# Patient Record
Sex: Female | Born: 1950 | Race: White | Hispanic: No | Marital: Single | Smoking: Never smoker
Health system: Southern US, Community
[De-identification: ages and names within clinical notes are randomized; demographics above are authoritative.]

## PROBLEM LIST (undated history)

## (undated) DIAGNOSIS — N059 Unspecified nephritic syndrome with unspecified morphologic changes: Secondary | ICD-10-CM

## (undated) DIAGNOSIS — J45909 Unspecified asthma, uncomplicated: Secondary | ICD-10-CM

## (undated) DIAGNOSIS — Z8542 Personal history of malignant neoplasm of other parts of uterus: Secondary | ICD-10-CM

## (undated) DIAGNOSIS — Z8669 Personal history of other diseases of the nervous system and sense organs: Secondary | ICD-10-CM

## (undated) DIAGNOSIS — I739 Peripheral vascular disease, unspecified: Secondary | ICD-10-CM

## (undated) DIAGNOSIS — R51 Headache: Secondary | ICD-10-CM

## (undated) DIAGNOSIS — S0300XA Dislocation of jaw, unspecified side, initial encounter: Secondary | ICD-10-CM

## (undated) DIAGNOSIS — N183 Chronic kidney disease, stage 3 unspecified: Secondary | ICD-10-CM

## (undated) DIAGNOSIS — C801 Malignant (primary) neoplasm, unspecified: Secondary | ICD-10-CM

## (undated) DIAGNOSIS — D649 Anemia, unspecified: Secondary | ICD-10-CM

## (undated) DIAGNOSIS — K219 Gastro-esophageal reflux disease without esophagitis: Secondary | ICD-10-CM

## (undated) DIAGNOSIS — E785 Hyperlipidemia, unspecified: Secondary | ICD-10-CM

## (undated) DIAGNOSIS — G4730 Sleep apnea, unspecified: Secondary | ICD-10-CM

## (undated) DIAGNOSIS — I10 Essential (primary) hypertension: Secondary | ICD-10-CM

## (undated) DIAGNOSIS — R011 Cardiac murmur, unspecified: Secondary | ICD-10-CM

## (undated) DIAGNOSIS — K635 Polyp of colon: Secondary | ICD-10-CM

## (undated) DIAGNOSIS — T7840XA Allergy, unspecified, initial encounter: Secondary | ICD-10-CM

## (undated) DIAGNOSIS — E039 Hypothyroidism, unspecified: Secondary | ICD-10-CM

## (undated) DIAGNOSIS — H269 Unspecified cataract: Secondary | ICD-10-CM

## (undated) DIAGNOSIS — C189 Malignant neoplasm of colon, unspecified: Secondary | ICD-10-CM

## (undated) DIAGNOSIS — M1990 Unspecified osteoarthritis, unspecified site: Secondary | ICD-10-CM

## (undated) HISTORY — DX: Chronic kidney disease, stage 3 unspecified: N18.30

## (undated) HISTORY — PX: KNEE ARTHROSCOPY: SUR90

## (undated) HISTORY — DX: Personal history of malignant neoplasm of other parts of uterus: Z85.42

## (undated) HISTORY — DX: Hyperlipidemia, unspecified: E78.5

## (undated) HISTORY — DX: Unspecified asthma, uncomplicated: J45.909

## (undated) HISTORY — PX: RENAL BIOPSY: SHX156

## (undated) HISTORY — DX: Polyp of colon: K63.5

## (undated) HISTORY — DX: Personal history of other diseases of the nervous system and sense organs: Z86.69

## (undated) HISTORY — PX: TONSILLECTOMY AND ADENOIDECTOMY: SUR1326

## (undated) HISTORY — PX: HIP ARTHROPLASTY: SHX981

## (undated) HISTORY — PX: LYMPHADENECTOMY: SHX15

## (undated) HISTORY — DX: Chronic kidney disease, stage 3 (moderate): N18.3

## (undated) HISTORY — DX: Malignant neoplasm of colon, unspecified: C18.9

## (undated) HISTORY — DX: Sleep apnea, unspecified: G47.30

## (undated) HISTORY — DX: Unspecified cataract: H26.9

## (undated) HISTORY — PX: ABDOMINAL HYSTERECTOMY: SHX81

## (undated) HISTORY — DX: Hypothyroidism, unspecified: E03.9

## (undated) HISTORY — DX: Anemia, unspecified: D64.9

## (undated) HISTORY — DX: Dislocation of jaw, unspecified side, initial encounter: S03.00XA

## (undated) HISTORY — PX: TOTAL ABDOMINAL HYSTERECTOMY W/ BILATERAL SALPINGOOPHORECTOMY: SHX83

## (undated) HISTORY — PX: UPPER GASTROINTESTINAL ENDOSCOPY: SHX188

## (undated) HISTORY — DX: Allergy, unspecified, initial encounter: T78.40XA

## (undated) HISTORY — PX: COLONOSCOPY W/ POLYPECTOMY: SHX1380

## (undated) HISTORY — DX: Essential (primary) hypertension: I10

## (undated) HISTORY — DX: Unspecified nephritic syndrome with unspecified morphologic changes: N05.9

## (undated) HISTORY — PX: EXPLORATORY LAPAROTOMY: SUR591

## (undated) HISTORY — PX: COLONOSCOPY: SHX174

---

## 1999-03-04 ENCOUNTER — Other Ambulatory Visit: Admission: RE | Admit: 1999-03-04 | Discharge: 1999-03-04 | Payer: Self-pay | Admitting: *Deleted

## 2000-04-10 ENCOUNTER — Other Ambulatory Visit: Admission: RE | Admit: 2000-04-10 | Discharge: 2000-04-10 | Payer: Self-pay | Admitting: *Deleted

## 2001-03-11 ENCOUNTER — Other Ambulatory Visit: Admission: RE | Admit: 2001-03-11 | Discharge: 2001-03-11 | Payer: Self-pay | Admitting: *Deleted

## 2001-05-26 ENCOUNTER — Encounter: Payer: Self-pay | Admitting: Emergency Medicine

## 2001-05-26 ENCOUNTER — Emergency Department (HOSPITAL_COMMUNITY): Admission: EM | Admit: 2001-05-26 | Discharge: 2001-05-26 | Payer: Self-pay | Admitting: Unknown Physician Specialty

## 2002-03-14 ENCOUNTER — Other Ambulatory Visit: Admission: RE | Admit: 2002-03-14 | Discharge: 2002-03-14 | Payer: Self-pay | Admitting: *Deleted

## 2002-12-15 DIAGNOSIS — K635 Polyp of colon: Secondary | ICD-10-CM

## 2002-12-15 HISTORY — DX: Polyp of colon: K63.5

## 2002-12-16 ENCOUNTER — Encounter (INDEPENDENT_AMBULATORY_CARE_PROVIDER_SITE_OTHER): Payer: Self-pay | Admitting: Specialist

## 2002-12-16 ENCOUNTER — Ambulatory Visit (HOSPITAL_COMMUNITY): Admission: RE | Admit: 2002-12-16 | Discharge: 2002-12-16 | Payer: Self-pay | Admitting: Gastroenterology

## 2003-03-27 ENCOUNTER — Other Ambulatory Visit: Admission: RE | Admit: 2003-03-27 | Discharge: 2003-03-27 | Payer: Self-pay | Admitting: *Deleted

## 2004-03-13 ENCOUNTER — Other Ambulatory Visit: Admission: RE | Admit: 2004-03-13 | Discharge: 2004-03-13 | Payer: Self-pay | Admitting: Family Medicine

## 2005-01-29 ENCOUNTER — Encounter: Admission: RE | Admit: 2005-01-29 | Discharge: 2005-01-29 | Payer: Self-pay | Admitting: Obstetrics and Gynecology

## 2005-01-30 ENCOUNTER — Encounter: Admission: RE | Admit: 2005-01-30 | Discharge: 2005-01-30 | Payer: Self-pay | Admitting: Obstetrics and Gynecology

## 2005-01-31 ENCOUNTER — Encounter: Admission: RE | Admit: 2005-01-31 | Discharge: 2005-01-31 | Payer: Self-pay | Admitting: Obstetrics and Gynecology

## 2005-02-04 ENCOUNTER — Ambulatory Visit: Admission: RE | Admit: 2005-02-04 | Discharge: 2005-02-04 | Payer: Self-pay | Admitting: Gynecology

## 2005-02-04 ENCOUNTER — Ambulatory Visit (HOSPITAL_COMMUNITY): Admission: RE | Admit: 2005-02-04 | Discharge: 2005-02-04 | Payer: Self-pay | Admitting: Gastroenterology

## 2005-02-18 ENCOUNTER — Encounter (INDEPENDENT_AMBULATORY_CARE_PROVIDER_SITE_OTHER): Payer: Self-pay | Admitting: *Deleted

## 2005-02-18 ENCOUNTER — Inpatient Hospital Stay (HOSPITAL_COMMUNITY): Admission: RE | Admit: 2005-02-18 | Discharge: 2005-02-20 | Payer: Self-pay | Admitting: Obstetrics & Gynecology

## 2005-02-18 ENCOUNTER — Encounter (INDEPENDENT_AMBULATORY_CARE_PROVIDER_SITE_OTHER): Payer: Self-pay | Admitting: Specialist

## 2005-03-05 ENCOUNTER — Ambulatory Visit: Admission: RE | Admit: 2005-03-05 | Discharge: 2005-03-05 | Payer: Self-pay | Admitting: Gynecology

## 2005-04-11 ENCOUNTER — Ambulatory Visit: Admission: RE | Admit: 2005-04-11 | Discharge: 2005-04-11 | Payer: Self-pay | Admitting: Gynecology

## 2005-07-15 ENCOUNTER — Encounter (INDEPENDENT_AMBULATORY_CARE_PROVIDER_SITE_OTHER): Payer: Self-pay | Admitting: *Deleted

## 2005-07-15 ENCOUNTER — Other Ambulatory Visit: Admission: RE | Admit: 2005-07-15 | Discharge: 2005-07-15 | Payer: Self-pay | Admitting: Gynecology

## 2005-07-15 ENCOUNTER — Ambulatory Visit: Admission: RE | Admit: 2005-07-15 | Discharge: 2005-07-15 | Payer: Self-pay | Admitting: Gynecology

## 2005-11-21 ENCOUNTER — Other Ambulatory Visit: Admission: RE | Admit: 2005-11-21 | Discharge: 2005-11-21 | Payer: Self-pay | Admitting: Obstetrics and Gynecology

## 2006-02-20 ENCOUNTER — Encounter: Admission: RE | Admit: 2006-02-20 | Discharge: 2006-02-20 | Payer: Self-pay | Admitting: Family Medicine

## 2006-06-23 ENCOUNTER — Other Ambulatory Visit: Admission: RE | Admit: 2006-06-23 | Discharge: 2006-06-23 | Payer: Self-pay | Admitting: Obstetrics and Gynecology

## 2006-12-02 ENCOUNTER — Encounter (INDEPENDENT_AMBULATORY_CARE_PROVIDER_SITE_OTHER): Payer: Self-pay | Admitting: *Deleted

## 2006-12-02 ENCOUNTER — Other Ambulatory Visit: Admission: RE | Admit: 2006-12-02 | Discharge: 2006-12-02 | Payer: Self-pay | Admitting: Gynecology

## 2006-12-02 ENCOUNTER — Ambulatory Visit: Admission: RE | Admit: 2006-12-02 | Discharge: 2006-12-02 | Payer: Self-pay | Admitting: Gynecology

## 2007-03-15 ENCOUNTER — Ambulatory Visit (HOSPITAL_COMMUNITY): Admission: RE | Admit: 2007-03-15 | Discharge: 2007-03-15 | Payer: Self-pay | Admitting: Family Medicine

## 2007-08-31 ENCOUNTER — Other Ambulatory Visit: Admission: RE | Admit: 2007-08-31 | Discharge: 2007-08-31 | Payer: Self-pay | Admitting: Obstetrics and Gynecology

## 2007-12-15 ENCOUNTER — Ambulatory Visit: Admission: RE | Admit: 2007-12-15 | Discharge: 2007-12-15 | Payer: Self-pay | Admitting: Gynecology

## 2007-12-15 ENCOUNTER — Encounter: Payer: Self-pay | Admitting: Gynecology

## 2007-12-15 ENCOUNTER — Other Ambulatory Visit: Admission: RE | Admit: 2007-12-15 | Discharge: 2007-12-15 | Payer: Self-pay | Admitting: Gynecology

## 2008-03-15 ENCOUNTER — Ambulatory Visit (HOSPITAL_COMMUNITY): Admission: RE | Admit: 2008-03-15 | Discharge: 2008-03-15 | Payer: Self-pay | Admitting: Family Medicine

## 2008-05-30 ENCOUNTER — Other Ambulatory Visit: Admission: RE | Admit: 2008-05-30 | Discharge: 2008-05-30 | Payer: Self-pay | Admitting: Obstetrics and Gynecology

## 2008-11-01 ENCOUNTER — Encounter: Payer: Self-pay | Admitting: Gynecology

## 2008-11-01 ENCOUNTER — Other Ambulatory Visit: Admission: RE | Admit: 2008-11-01 | Discharge: 2008-11-01 | Payer: Self-pay | Admitting: Gynecology

## 2008-11-01 ENCOUNTER — Ambulatory Visit: Admission: RE | Admit: 2008-11-01 | Discharge: 2008-11-01 | Payer: Self-pay | Admitting: Gynecology

## 2009-03-20 ENCOUNTER — Ambulatory Visit (HOSPITAL_COMMUNITY): Admission: RE | Admit: 2009-03-20 | Discharge: 2009-03-20 | Payer: Self-pay | Admitting: Family Medicine

## 2009-05-30 ENCOUNTER — Other Ambulatory Visit: Admission: RE | Admit: 2009-05-30 | Discharge: 2009-05-30 | Payer: Self-pay | Admitting: Obstetrics and Gynecology

## 2009-12-07 ENCOUNTER — Ambulatory Visit: Admission: RE | Admit: 2009-12-07 | Discharge: 2009-12-07 | Payer: Self-pay | Admitting: Gynecology

## 2009-12-07 ENCOUNTER — Other Ambulatory Visit: Admission: RE | Admit: 2009-12-07 | Discharge: 2009-12-07 | Payer: Self-pay | Admitting: Gynecology

## 2010-03-22 ENCOUNTER — Ambulatory Visit (HOSPITAL_COMMUNITY): Admission: RE | Admit: 2010-03-22 | Discharge: 2010-03-22 | Payer: Self-pay | Admitting: Family Medicine

## 2010-06-04 ENCOUNTER — Other Ambulatory Visit: Admission: RE | Admit: 2010-06-04 | Discharge: 2010-06-04 | Payer: Self-pay | Admitting: Obstetrics and Gynecology

## 2010-10-06 ENCOUNTER — Encounter: Payer: Self-pay | Admitting: Obstetrics and Gynecology

## 2010-10-06 ENCOUNTER — Encounter: Payer: Self-pay | Admitting: *Deleted

## 2011-01-28 NOTE — Consult Note (Signed)
NAMEJANIKA, Colon             ACCOUNT NO.:  [id number]   MEDICAL RECORD NO.:  [id number]          PATIENT TYPE:  OUT   LOCATION:  GYN                          FACILITY:  [facility]   PHYSICIAN:  De Blanch, M.D.DATE OF BIRTH:  08-05-1951   DATE OF CONSULTATION:  DATE OF DISCHARGE:                                 CONSULTATION   CHIEF COMPLAINT:  Endometrial cancer.   INTERVAL HISTORY:  The patient returns today for continuing followup.  She saw Dr. Thomasena Edis approximately 6 months ago and returns today as  scheduled.  Since her last visit, she has done well.  She denies any GI  or GU  symptoms.  Has no pelvic pain, pressure, vaginal bleeding or  discharge.  Functional status has been excellent.   HISTORY OF PRESENT ILLNESS:  Stage 1a grade 3 endometrial carcinoma,  undergoing initial TAH-BSO,  pelvic and periaortic lymphadenectomy June  2006.  She had good prognostic features, and no further therapy was  advised.   PAST MEDICAL HISTORY:  Medical illnesses:  Hypercholesterolemia,  menopause and hypothyroidism.   PAST SURGICAL HISTORY:  Renal biopsy for nephritis, TAH-BSO, pelvic and  periaortic lymphadenectomy.   DRUG ALLERGIES:  SULFA.   CURRENT MEDICATIONS:  Synthroid, Lipitor, multivitamins.   FAMILY HISTORY:  Paternal family history of colon cancer and maternal  aunt with breast cancer.   SOCIAL HISTORY:  The patient is not married.  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  She  does not smoke.   REVIEW OF SYSTEMS:  Ten-point comprehensive review of systems negative  except as noted above.   PHYSICAL EXAMINATION:  VITAL SIGNS:  Weight 196 pounds (stable).  Blood  pressure 142/86.  GENERAL:  The patient is a pleasant, mildly obese white female in no  acute distress.  HEENT:  Negative.  NECK:  Supple without thyromegaly.  There is no supraclavicular or  inguinal adenopathy.  ABDOMEN:  Soft, nontender.  No  mass or organomegaly, ascites or hernia  is noted.  PELVIC:  EG, BUS, vagina, bladder, urethra are normal.  Cervix and  uterus are surgically absent.  Adnexa without masses.  Rectovaginal exam  confirms.  EXTREMITIES:  Lower extremities are without edema or varicosities.   IMPRESSION:  Stage 1a grade 3 endometrial cancer in 2006.  No evidence  of recurrent disease.   PLAN:  The patient will return to see Dr. Thomasena Edis in 6 months, return to  see [place] in 1 year.  Pap smears are repeated today.      De Blanch, M.D.  Electronically Signed     DC/MEDQ  D:  12/15/2007  T:  12/15/2007  Job:  [id number]   cc:   Artist Pais, M.D.  Fax: [phone]   Telford Nab, R.N.  [address]  [place], [place] [address]

## 2011-01-28 NOTE — Consult Note (Signed)
[name], [name]             ACCOUNT NO.:  [id number]   MEDICAL RECORD NO.:  [id number]           PATIENT TYPE:  OUT   LOCATION:  GYN                          FACILITY:  [facility]   PHYSICIAN:  De Blanch, M.D.DATE OF BIRTH:  12-26-1950   DATE OF CONSULTATION:  DATE OF DISCHARGE:                                 CONSULTATION   CHIEF COMPLAINT:  Endometrial cancer.   INTERVAL HISTORY:  The patient returns today for continuing follow-up of  her endometrial cancer.  Since her last visit she has done well.  She  denies any GI or GU symptoms.  Has no pelvic pain, pressure, vaginal  bleeding or discharge.  Her functional status is excellent.   HISTORY OF PRESENT ILLNESS:  Stage IA, grade 3 endometrial  adenocarcinoma.  The patient underwent initial surgical staging June  2006.  She had good prognostic features, and no further therapy was  recommended.   PAST MEDICAL HISTORY:  Medical illnesses:   1. Hyperlipidemia.  2. Menopause.  3. Hypothyroidism.   PAST SURGICAL HISTORY:  1. Renal biopsy for nephritis.  2. TAH-BSO.  3. Pelvic and periaortic lymphadenectomy.   DRUG ALLERGIES:  SULFA (rash).   CURRENT MEDICATIONS:  Lipitor, multivitamin, Synthroid.   FAMILY HISTORY:  Paternal family history of colon cancer, a maternal  aunt with breast cancer.   SOCIAL HISTORY:  The patient is not married.  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  She is  based in [place].  Travels to [place] frequently.  She does  not smoke.   REVIEW OF SYSTEMS:  Ten-point comprehensive review of systems negative  except as noted above.   PHYSICAL EXAMINATION:  Weight 200 pounds, blood pressure 130/70, pulse  80, respiratory rate 20.  GENERAL:  The patient is a healthy white female in no acute distress.  HEENT:  Negative.  NECK:  Supple without thyromegaly.  There is no supraclavicular or  inguinal adenopathy.  ABDOMEN:  Soft,  nontender.  No masses, organomegaly, ascites or hernias  are noted.  PELVIC:  EG/BUS, vagina, bladder, urethra are normal.  Cervix and uterus  are surgically absent.  Adnexa without masses.  RECTOVAGINAL:  Exam confirms.  Lower extremities without edema or varicosities.   IMPRESSION:  Stage IA, grade 3 endometrial cancer June 2006.  No  evidence of disease.   PLAN:  Pap smears are obtained.  The patient returns to see Dr. Artist Pais in 6 months.  Return to see [place] in 1 year.      De Blanch, M.D.  Electronically Signed     DC/MEDQ  D:  11/01/2008  T:  11/01/2008  Job:  [id number]   cc:   Artist Pais, M.D.  Fax: [phone]   Telford Nab, R.N.  [address]  [place], [place] [address]

## 2011-01-31 NOTE — Consult Note (Signed)
[name], [name]             ACCOUNT NO.:  [id number]   MEDICAL RECORD NO.:  [id number]          PATIENT TYPE:  OUT   LOCATION:  GYN                          FACILITY:  [facility]   PHYSICIAN:  De Blanch, M.D.DATE OF BIRTH:  09/30/1950   DATE OF CONSULTATION:  03/05/2005  DATE OF DISCHARGE:                                   CONSULTATION   HISTORY:  A 60 year old white female who returns for a postoperative  followup having undergone a total abdominal hysterectomy and bilateral  salpingo-oophorectomy and pelvic aortic lymphadenectomy on February 18, 2005.  The final pathology showed a poorly differentiated endometrial carcinoma  with no evidence of myometrial invasion and all lymph nodes were free of  metastatic disease and peritoneal washings were negative (stage 1A grade  III).  The patient has had an uncomplicated postoperative course.   PHYSICAL EXAMINATION:  VITAL SIGNS:  Weight 177 pounds.  ABDOMEN:  Soft, non-tender.  No masses, ascites, organomegaly, or hernias  noted.  Midline incision is well healed.  EXTREMITIES:  Lower extremities without edema or varicosities.   IMPRESSION:  Stage 1 grade III endometrial carcinoma having a good  postoperative course now at two weeks.  The patient is instructed as to the  limits of her activity and she will return to see me for a six week  postoperative checkup.  We did discuss the pathology and given the limited  amount of her malignancy do not believe that any adjuvant therapy would be  of benefit to the patient.  We will see her again in four weeks for a  followup.       DC/MEDQ  D:  03/05/2005  T:  03/05/2005  Job:  [id number]   cc:   Artist Pais, M.D.  [address]  [place]  [place] [address]  Fax: [phone]   Telford Nab, R.N.  [phone] [address]  [place], [place] [address]

## 2011-01-31 NOTE — Op Note (Signed)
   [name], [name]                       ACCOUNT NO.:  [id number]   MEDICAL RECORD NO.:  [id number]                   PATIENT TYPE:  AMB   LOCATION:  ENDO                                 FACILITY:  [facility]   PHYSICIAN:  John C. Madilyn Fireman, M.D.                 DATE OF BIRTH:  May 19, 1951   DATE OF PROCEDURE:  12/16/2002  DATE OF DISCHARGE:                                 OPERATIVE REPORT   PROCEDURE:  Colonoscopy with polypectomy.   INDICATIONS FOR PROCEDURE:  Family history of colon cancer in a first degree  relative.   DESCRIPTION OF PROCEDURE:  The patient was placed in the left lateral  decubitus position then placed on the pulse monitor with continuous low flow  oxygen delivered by nasal cannula. She was sedated with 100 mcg IV fentanyl  and 7.5 mg  IV Versed. The Olympus video colonoscope was inserted into the  rectum and advanced to the cecum, confirmed by transillumination at  McBurney's point and visualization of the ileocecal valve and appendiceal  orifice. The prep was excellent. Near the base of the cecum, there was a  vaguely delineated sessile polyp approximately 1.25.4 cm in diameter and  this was carefully ensnared and removed with the snare loop. A similar  appearing polyp slightly smaller, approximately 8 x 4 mm, was seen at the  junction between the cecum and the ascending colon and was also removed by  snare. Both polyps were sent in the same specimen container. The remainder  of the cecum, ascending, transverse, and descending colon all appeared  normal with no further masses, polyps, diverticula or other mucosal  abnormalities. Within the sigmoid colon, there were no diverticula noted in  the sigmoid colon. Near the rectosigmoid junction at approximately 20 cm,  there was a diminutive 4 polyp which was fulgurated by hot biopsy. The  remainder of the rectosigmoid and rectum appeared normal.  The scope was  then withdrawn and the patient returned to the recovery  room in stable  condition. The patient tolerated the procedure well and there were no  immediate complications.   IMPRESSION:  Cecal, ascending and rectosigmoid colon polyps.   PLAN:  Await histology to determine interval for next surveillance  colonoscopy.                                                John C. Madilyn Fireman, M.D.    JCH/MEDQ  D:  12/16/2002  T:  12/16/2002  Job:  [id number]   cc:   Tracie Harrier, M.D.  [address] [place]  [place] [address]  Fax: [phone]

## 2011-01-31 NOTE — Discharge Summary (Signed)
[name], [name]             ACCOUNT NO.:  [id number]   MEDICAL RECORD NO.:  [id number]          PATIENT TYPE:  INP   LOCATION:  1619                         [facility]ACILITY:  [facility]   PHYSICIAN:  Roseanna Rainbow, M.D.DATE OF BIRTH:  Dec 15, 1950   DATE OF ADMISSION:  02/18/2005  DATE OF DISCHARGE:  02/20/2005                                 DISCHARGE SUMMARY   CHIEF COMPLAINT:  The patient is a 60 year old with a newly-diagnosed  endometrial cancer who presents for exploratory laparotomy, total abdominal  hysterectomy and bilateral salpingo-oophorectomy and surgical staging.  Please see the dictated history and physical as per Dr. Katheren Shams-  Sharol Given for further details.   HOSPITAL COURSE:  The patient was admitted, and underwent a total abdominal  hysterectomy, bilateral salpingo-oophorectomy with pelvic and paraaortic  lymphadenectomy.  Please see the dictated operative summary as per Dr.  De Blanch for further details.  The patient's postoperative  course was uneventful.  She was discharged to home on postoperative day #2,  tolerating a regular diet.   DISCHARGE DIAGNOSIS:  Stage IA endometrioid carcinoma.   PROCEDURE:  1.  Total abdominal hysterectomy.  2.  Bilateral salpingo-oophorectomy.  3.  Pelvic and paraaortic lymphadenectomy.   CONDITION ON DISCHARGE:  Stable.   DIET:  Regular.   ACTIVITY:  1.  No strenuous activity.  2.  Pelvic rest.   DISCHARGE MEDICATIONS:  1.  Resume home medications.  2.  Vicodin.   DISPOSITION:  The patient was to follow up in the GYN oncology office on  February 24, 2005 at 10:00 a.m. for staple removal.       LAJ/MEDQ  D:  03/11/2005  T:  03/11/2005  Job:  [id number]   cc:   Telford Nab, R.N.  [address]  [place], [place] [address]   Artist Pais, M.D.  [address]  [place]  [place] [address]  Fax: [phone]   Lavonda Jumbo, M.D.  [address] [place], [place] [address]  Fax: [phone]

## 2011-01-31 NOTE — Consult Note (Signed)
[name], Colon             ACCOUNT NO.:  [id number]   MEDICAL RECORD NO.:  [id number]          PATIENT TYPE:  OUT   LOCATION:  GYN                          FACILITY:  [facility]   PHYSICIAN:  De Blanch, M.D.DATE OF BIRTH:  May 29, 1951   DATE OF CONSULTATION:  12/02/2006  DATE OF DISCHARGE:                                 CONSULTATION   CHIEF COMPLAINT:  Endometrial cancer.   INTERVAL HISTORY:  The patient returns continuing follow-up of a stage  Ia grade 3 endometrial carcinoma.  Since her last visit with me she has  seen Dr. Thomasena Edis on two occasions and has had normal exams.  Overall,  she has done well.  She denies any GI or GU symptoms.  Has no pelvic  pain, pressure, vaginal bleeding or discharge.  Her functional status is  excellent.   HISTORY OF PRESENT ILLNESS:  A stage Ia grade 3 endometrial carcinoma,  undergoing TAH/BSO, pelvic and periaortic lymphadenectomy February 18, 2005.  No further therapy was advised.   PAST MEDICAL HISTORY:  Medical illnesses:  Hypercholesterolemia,  menopause, hypothyroidism.   Past surgical history:  Renal biopsy for nephritis, TAH/BSO, and pelvic  and periaortic lymphadenectomy.   DRUG ALLERGIES:  SULFA.   CURRENT MEDICATIONS:  Synthroid, Lipitor, multivitamins, vitamin B  complex.   PAST SURGICAL HISTORY:  Arthroscopic knee surgery, tonsils and  adenoidectomy, renal biopsy, and TAH/BSO, pelvic and periaortic  lymphadenectomy.   FAMILY HISTORY:  Paternal family history of colon cancer and maternal  aunt with breast cancer.   SOCIAL HISTORY:  The patient is not married.  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  She  does not smoke.   REVIEW OF SYSTEMS:  A 10-point coverage review of systems negative  except as noted above.   PHYSICAL EXAMINATION:  VITAL SIGNS:  Weight 196 pounds, blood pressure  130/80, pulse 80, respiratory rate 20.  GENERAL:  The patient is a healthy  white female in no acute distress.  HEENT:  Negative.  NECK:  Supple without thyromegaly.  LYMPH:  There is no supraclavicular or inguinal adenopathy.  ABDOMEN:  Soft, nontender.  No mass, organomegaly, ascites or hernias  are noted.  PELVIC:  EG/BUS, vagina, bladder, urethra are normal.  Cervix and uterus  are surgically absent.  No lesions are noted.  Bimanual and rectovaginal  exam reveal no mass, induration or nodularity.  LOWER EXTREMITIES:  Reveal no edema.  There is no CVA tenderness.   IMPRESSION:  Stage Ia grade 3 endometrial cancer.  No evidence of  recurrent disease.   PLAN:  Pap smears are obtained.  The patient will return to see Dr.  Artist Pais in 6 months, return to see [place] in 1 year.      De Blanch, M.D.  Electronically Signed     DC/MEDQ  D:  12/02/2006  T:  12/02/2006  Job:  [id number]   cc:   Telford Nab, R.N.  [address]  [place], [place] [address]   Artist Pais, M.D.  Fax: [phone]

## 2011-01-31 NOTE — Op Note (Signed)
[name], [name]             ACCOUNT NO.:  [id number]   MEDICAL RECORD NO.:  [id number]          PATIENT TYPE:  INP   LOCATION:  0004                         FACILITY:  [facility]   PHYSICIAN:  De Blanch, M.D.DATE OF BIRTH:  [birth year]   DATE OF PROCEDURE:  02/18/2005  DATE OF DISCHARGE:                                 OPERATIVE REPORT   PREOPERATIVE DIAGNOSIS:  Grade 3 endometrial adenocarcinoma.   POSTOPERATIVE DIAGNOSIS:  Grade 3 endometrial adenocarcinoma.   PROCEDURE:  Total abdominal surgery, bilateral salpingo-oophorectomy, pelvic  and periaortic lymphadenectomy.   SURGEON:  De Blanch, M.D.   ASSISTANTS:  Roseanna Rainbow, M.D., and Telford Nab, R.N.   ANESTHESIA:  General with orotracheal tube.   ESTIMATED BLOOD LOSS:  150 mL.   SURGICAL FINDINGS:  At the time of exploratory laparotomy, the uterus was  small.  The tubes and ovaries were normal.  Pelvic and periaortic lymph  nodes appeared normal.  The exploration of the upper abdomen including the  diaphragm, liver, spleen and stomach, was normal.  The appendix appeared  normal.  There was no evidence of extrauterine disease.  On frozen section  there was a 1 cm lesion in the fundus of the uterus without obvious  myometrial invasion.   PROCEDURE:  The patient was brought to the operating room and after  satisfactory attainment of general anesthesia was placed in a modified  lithotomy position in [place] stirrups.  The anterior abdominal wall, perineum  and vagina were prepped with Betadine and a Foley catheter was inserted, and  the patient was draped.  The abdomen was entered through a low midline  incision ultimately extending above the umbilicus.  Peritoneal washings were obtained and sent to cytopathology.  The upper  abdomen and pelvis were explored with the above-noted findings.  A  Bookwalter retractor was assembled and bowel was packed out the pelvis.  The  uterus was grasped  with long Kelly clamps.  The round ligaments were  divided.  The peritoneum and lateral pelvic sidewalls opened and the  paravesical and pararectal spaces opened.  The ovarian vessels were  skeletonized, clamped, cut, free tied and suture ligated.  Care was taken to  avoid injury to the ureter.  The bladder flap was advanced with sharp and  blunt dissection.  The uterine vessels were skeletonized, clamped, cut and  suture ligated.  In a stepwise fashion, the paracervical and cardinal  ligaments were clamped, cut and suture ligated.  The vaginal angle was  crossclamped and the vagina transected from its connection to the cervix.  The vaginal angles were transfixed with 0 Vicryl and the central portion of  the vagina closed with interrupted figure-of-eight sutures of 0 Vicryl.  The  uterus, cervix, tubes and ovaries were sent to pathology with the above-  noted findings.   The retractors were repositioned and pelvic lymphadenectomy was performed,  excising lymph nodes from the external iliac artery and vein, the internal  iliac artery and obturator fossa.  Hemostasis achieved with cautery and  Hemoclips.  Throughout the dissection, care was taken to avoid vascular  injury.  The  obturator nerve and the genitofemoral nerves were identified  and protected throughout the dissection.  A similar procedure was performed on both sides of the pelvis.   The incision was extended, the Bookwalter retractor repositioned.  A  peritoneal incision was made overlying the right common iliac artery and  along the aorta.  The right ureter was mobilized laterally and the duodenum  mobilized cephalad.  With the vena cava and aorta exposed, para-aortic  lymphadenectomy was performed excising lymph nodes from the common iliac  chain to approximately 3 cm above the inferior mesenteric artery.  Hemostasis again achieved with Hemoclips and cautery.   The peritoneal incision along the mesentery of the sigmoid colon  was  extended caudad.  The dissection was then carried along the left common  iliac artery.  The left ureter was identified, mobilized away from the psoas  muscle and held behind a retractor.  The lymph nodes lateral to the left  common iliac artery and left-sided aorta were then excised using sharp and  blunt dissection.  Hemostasis again achieved with cautery and Hemoclips.  These lymph nodes were submitted as left periaortic lymph nodes.  The  surgical field was reinspected and found to be hemostatic both in the aortic  chain and in the pelvis.   Packs and retractors were removed.  The anterior abdominal wall was closed  in layers, the first being a running mass closure using #1 PDS.  Subcutaneous tissue was irrigated and the skin closed with skin staples.  A  dressing was applied.  The patient was awakened from anesthesia and taken to  the recovery room in satisfactory condition.  Sponge, needle and instrument  counts correct x2.       DC/MEDQ  D:  02/18/2005  T:  02/18/2005  Job:  [id number]   cc:   Roseanna Rainbow, M.D.   Artist Pais, M.D.  [address]  [place]  [place] [address]  Fax: [phone]   Telford Nab, R.N.  [phone] [address]  [place], [place] [address]

## 2011-01-31 NOTE — Consult Note (Signed)
NAMEVINCENZINA, [name]             ACCOUNT NO.:  [id number]   MEDICAL RECORD NO.:  [id number]          PATIENT TYPE:  OUT   LOCATION:  GYN                          FACILITY:  [facility]   PHYSICIAN:  De Blanch, M.D.DATE OF BIRTH:  01/31/1951   DATE OF CONSULTATION:  04/11/2005  DATE OF DISCHARGE:                                   CONSULTATION   GYNECOLOGIC/ONCOLOGY CLINIC   REASON FOR VISIT:  This 60 year old, white female returns for a 6-week  postoperative period.  She has had an uncomplicated postoperative course.  She reports that she has returned to work full-time and was at a meeting in  [place] until 9 p.m. last night.  She does have some fatigue. She denies any  fever, chills or any significant pain.   PAST MEDICAL HISTORY:  Stage IA, grade 3 endometrial carcinoma undergoing  initial surgical resection and staging in June 2006.   PHYSICAL EXAMINATION:  VITAL SIGNS:  Weight 179 pounds.  Blood pressure  160/90.  GENERAL:  The patient is a healthy, white female in no acute distress.  ABDOMEN:  Soft, nontender.  Midline incision is healing well.  PELVIC:  EG/BUS, vagina, urethra normal.  Vaginal cuff is healing nicely.  No lesions noted.  BIMANUAL/RECTOVAGINAL:  Minimal postoperative induration.   IMPRESSION:  Excellent postoperative recovery.  The patient is given the  okay to return to full levels of activity.  We outlined the surveillance  program and will have her return in 3 months for her first visit to include  Pap smear.  We will plan on alternating visits with Dr. Artist Pais.       DC/MEDQ  D:  04/11/2005  T:  04/11/2005  Job:  [id number]   cc:   Artist Pais, M.D.  [address]  [place]  [place] [address]  Fax: [phone]   Telford Nab, R.N.  [phone] [address]  [place], [place] [address]

## 2011-01-31 NOTE — Consult Note (Signed)
[name], [name]             ACCOUNT NO.:  [id number]   MEDICAL RECORD NO.:  [id number]          PATIENT TYPE:  OUT   LOCATION:  GYN                          FACILITY:  [facility]   PHYSICIAN:  De Blanch, M.D.DATE OF BIRTH:  Nov 29, 1950   DATE OF CONSULTATION:  DATE OF DISCHARGE:                                   CONSULTATION   REASON FOR CONSULTATION:  A 60 year old white female seen in consultation at  the request of Artist Pais, M.D. regarding newly diagnosed endometrial  carcinoma.   The patient had just a very few days of post menopausal bleeding and was  initially seen by her primary care physician, Lavonda Jumbo, M.D., who  referred the patient to Artist Pais, M.D.  Endometrial biopsy was  inconclusive and subsequently the patient underwent a fractional D&C on Jan 23, 2005. Pathology showed a poorly differentiated carcinoma in the  endometrium and a normal endocervical curettage. The tumor growth pattern in  the endometrial lesion showed a solid growth pattern and there are signet  ring cells present. The question as to whether this is a metastatic site was  raised by the pathologist. The tumor was estrogen and progesterone receptor  positive. Workup to date has included a CT scan of the chest, abdomen and  pelvis revealing a 6 cm cyst in the left kidney upper limits normal size  retroperitoneal lymph nodes and no other evidence of metastatic disease. The  patient is scheduled to have endoscopy later this afternoon. She has also  undergone extensive breast evaluation including MRI of the breast on May 18  which was normal, complimentary diagnostic mammography and breast  ultrasound.   The patient currently is not having any bleeding, denies any pain and has no  constitutional symptoms.   PAST MEDICAL HISTORY:  Medical illness:  Hypercholesterolemia, menopause,  hypothyroidism.   PAST SURGICAL HISTORY:  Renal biopsy for nephritis as a child.   ALLERGIES:   SULFA causes hives.   CURRENT MEDICATIONS:  Synthroid, Lipitor, multivitamin and vitamin B.   PAST SURGICAL HISTORY:  Tonsil and adenoidectomy, arthroscopy of her knee.   HEALTH MAINTENANCE:  The patient has had normal mammograms and had a normal  colonoscopy approximately two years ago.   FAMILY HISTORY:  Very strong for the father's side of the family with colon  cancer in the father, the patient's sister and several paternal aunts. She  also has a paternal aunt with breast cancer.   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 She does  not smoke.   REVIEW OF SYMPTOMS:  Negative except as noted above.   PHYSICAL EXAMINATION:  VITAL SIGNS:  Weight 174 pounds, blood pressure  142/86, pulse 88, respiratory rate 20.  GENERAL:  The patient is a healthy white female in no acute distress.  HEENT:  Negative.  NECK:  Supple without thyromegaly. There was no supraclavicular or inguinal  adenopathy.  ABDOMEN:  Moderately obese, soft, nontender, no mass, organomegaly, ascites  or hernias are noted.  PELVIC:  EGBUS, vagina, bladder, urethra are normal. Cervix is normal. There  is a minimal amount of blood at the cervical os, no lesions are noted.  Bimanual and rectovaginal exam reveal a retroverted uterus, normal shape,  size and consistency. There is no adnexal masses.   IMPRESSION:  Grade 3 endometrial cancer. The presence of signet ring cells  raised the question as to whether this is a metastatic lesion from a gastric  cancer. The patient will have upper endoscopy later upper endoscopy later  today. If the upper endoscopy is negative, I have recommended to the patient  and her sister that she undergo exploratory laparotomy, total abdominal  hysterectomy, and bilateral salpingo-oophorectomy and surgical staging  including pelvic and periaortic lymphadenopathy for treatment of what would  then  be considered an endometrial cancer. If she has metastatic disease from  the stomach, we will have to collaborate with gastroenterologist and  gastrointestinal surgeons to determine the best approach.   We did discuss the possibility of doing this procedure with minimal invasive  surgery by some of the gynecologic oncology surgeons at [facility] [place]. At the end of  the discussion, the patient wished to consider her options further and will  contact us when she decides which course of action she would like to take.      DC/MEDQ  D:  02/04/2005  T:  02/04/2005  Job:  [id number]   cc:   Artist Pais, M.D.  [address], [address]  [place]  [place] [address]  Fax: [phone]   Lavonda Jumbo, M.D.  [address] [place], [place] [address]  Fax: [phone]   Telford Nab, R.N.  [address]  [place], [place] [address]

## 2011-01-31 NOTE — Consult Note (Signed)
[name], [name]             ACCOUNT NO.:  [id number]   MEDICAL RECORD NO.:  [id number]          PATIENT TYPE:  OUT   LOCATION:  GYN                          FACILITY:  [facility]   PHYSICIAN:  De Blanch, M.D.DATE OF BIRTH:  09-08-1951   DATE OF CONSULTATION:  07/15/2005  DATE OF DISCHARGE:                                   CONSULTATION   CHIEF COMPLAINT:  Endometrial cancer.   INTERVAL HISTORY:  Since her last visit, the patient has done remarkably  well. She specifically denies any GI or GU symptoms; has no pelvic pain,  pressure, vaginal bleeding, or discharge. She had been using vitamin E on  her incision and has minimal discomfort on rare occasion. She has recently  returned from a weekend of golfing in [place] and also has taken a trip in  September to [place], [place], where she also golfed. She reports  that she is back to full levels of activity.   HISTORY OF PRESENT ILLNESS:  The patient underwent total abdominal surgery,  bilateral salpingo-oophorectomy, pelvic and periaortic lymphadenectomy February 18, 2005. Final pathology showed a stage Ia grade 3 endometrial cancer. No  further therapy was advised. She had an uncomplicated postoperative course.   PAST MEDICAL HISTORY:  Medical illnesses:  Hypercholesterolemia, menopause,  hypothyroidism. Past surgical history:  Renal biopsy for nephritis, TAH/BSO,  pelvic and periodic lymphadenectomy.   DRUG ALLERGIES:  SULFA.   CURRENT MEDICATIONS:  Synthroid, Lipitor, multivitamins, vitamin B complex.   PAST SURGICAL HISTORY:  Arthroscopic knee surgery, tonsil and adenoidectomy.   FAMILY HISTORY:  Father's side of family is strong for colon cancer. She has  a paternal aunt with breast cancer as well.   SOCIAL HISTORY:  The patient is not married. 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 She does not smoke.   REVIEW OF SYSTEMS:  Ten-point comprehensive review  of systems negative  except as noted above.   PHYSICAL EXAMINATION:  VITAL SIGNS:  Weight 185 pounds, blood pressure  156/90, pulse 80, respirations rate 20.  GENERAL:  The patient is a healthy white female in no acute distress.  HEENT:  Negative.  NECK:  Supple without thyromegaly. There is no supraclavicular or inguinal  adenopathy.  ABDOMEN:  Soft, nontender. No mass, organomegaly, ascites or hernias are  noted.  PELVIC:  EG/BUS, vagina, bladder, urethra are normal. Vaginal cuff is well  healed, supported, and no lesions are noted. Pap smears are obtained.  Bimanual and rectovaginal exam reveal no masses, induration or nodularity.   IMPRESSION:  Stage Ia grade 3 endometrial carcinoma. No evidence recurrent  disease.   PLAN:  Pap smears are obtained. The patient will return to see Dr. Artist Pais in 3 months and return to see [place] in 6 months. She will continue to  have annual mammograms.      De Blanch, M.D.  Electronically Signed     DC/MEDQ  D:  07/15/2005  T:  07/15/2005  Job:  [id number]   cc:   Artist Pais, M.D.  Fax: [phone]   Harriett Sine  Aundria Rud, R.N.  [address]  [place], [place] [address]

## 2011-01-31 NOTE — Op Note (Signed)
NAMEMARIETA, MARKOV             ACCOUNT NO.:  [id number]   MEDICAL RECORD NO.:  [id number]          PATIENT TYPE:  AMB   LOCATION:  ENDO                         FACILITY:  [facility]   PHYSICIAN:  John C. Madilyn Fireman, M.D.    DATE OF BIRTH:  Jul 10, 1951   DATE OF PROCEDURE:  02/04/2005  DATE OF DISCHARGE:                                 OPERATIVE REPORT   PROCEDURE:  Esophagogastroduodenoscopy   INDICATIONS FOR PROCEDURE:  Recent uterine biopsy showing a signet cell  carcinoma with histologic features suggestive of metastatic lesion, possibly  from stomach or breast. She is up to date on colon cancer screening and had  a mammogram and ultrasound of the breast which were unrevealing.  EGD is  requested to rule out a primary source of her malignancy.   DESCRIPTION OF PROCEDURE:  The patient was placed in the left lateral  decubitus position and placed on the pulse monitor with continuous low-flow  oxygen delivered by nasal cannula. She was sedated with 85 mcg IV fentanyl  and 10 mg IV Versed. The Olympus video endoscope was advanced, under direct  vision, into the oropharynx and esophagus. The esophagus was straight and of  normal caliber with the squamocolumnar line at 38 cm.  There was no hiatal  hernia, ring stricture, or other abnormality at the GE junction.  The  stomach was entered and a small amount of liquid secretions were suctioned  from the fundus. A retroflexed view of the cardia was unremarkable. The  fundus, body, antrum, and pylorus were all well inspected and appeared  normal; no evidence of neoplasm. The pylorus was not deformed and easily  allowed passage of the endoscope tip into the duodenum. Both bulb and second  portion well inspected and appeared to be within normal limits. The scope  was then withdrawn; and the patient returned to the recovery room in stable  condition. She tolerated the procedure well; and there were no immediate  complications.   IMPRESSION:  1.   Normal study with no evidence of gastric malignancy.      JCH/MEDQ  D:  02/04/2005  T:  02/04/2005  Job:  [id number]   cc:   Artist Pais, M.D.  [address]  [place]  [place] [address]  Fax: [phone]   Lavonda Jumbo, M.D.  [address] [place], [place] [address]  Fax: [phone]   De Blanch, M.D.

## 2011-02-26 ENCOUNTER — Other Ambulatory Visit (HOSPITAL_COMMUNITY): Payer: Self-pay | Admitting: Family Medicine

## 2011-02-26 DIAGNOSIS — Z1231 Encounter for screening mammogram for malignant neoplasm of breast: Secondary | ICD-10-CM

## 2011-03-28 ENCOUNTER — Ambulatory Visit (HOSPITAL_COMMUNITY): Admission: RE | Admit: 2011-03-28 | Payer: Self-pay | Source: Ambulatory Visit

## 2011-03-28 ENCOUNTER — Ambulatory Visit
Admission: RE | Admit: 2011-03-28 | Discharge: 2011-03-28 | Disposition: A | Discharge status: Home / Self Care | Payer: BC Managed Care – PPO | Source: Ambulatory Visit | Attending: Family Medicine | Admitting: Family Medicine

## 2011-03-28 DIAGNOSIS — Z1231 Encounter for screening mammogram for malignant neoplasm of breast: Secondary | ICD-10-CM

## 2011-06-05 ENCOUNTER — Other Ambulatory Visit: Payer: Self-pay | Admitting: Obstetrics and Gynecology

## 2011-06-05 ENCOUNTER — Other Ambulatory Visit (HOSPITAL_COMMUNITY)
Admission: RE | Admit: 2011-06-05 | Discharge: 2011-06-05 | Disposition: A | Discharge status: Home / Self Care | Payer: BC Managed Care – PPO | Source: Ambulatory Visit | Attending: Obstetrics and Gynecology | Admitting: Obstetrics and Gynecology

## 2011-06-05 DIAGNOSIS — Z01419 Encounter for gynecological examination (general) (routine) without abnormal findings: Secondary | ICD-10-CM | POA: Insufficient documentation

## 2012-03-10 ENCOUNTER — Other Ambulatory Visit (HOSPITAL_COMMUNITY): Payer: Self-pay | Admitting: Internal Medicine

## 2012-03-10 DIAGNOSIS — Z1231 Encounter for screening mammogram for malignant neoplasm of breast: Secondary | ICD-10-CM

## 2012-04-01 ENCOUNTER — Ambulatory Visit (HOSPITAL_COMMUNITY)
Admission: RE | Admit: 2012-04-01 | Discharge: 2012-04-01 | Disposition: A | Discharge status: Home / Self Care | Payer: BC Managed Care – PPO | Source: Ambulatory Visit | Attending: Internal Medicine | Admitting: Internal Medicine

## 2012-04-01 DIAGNOSIS — Z1231 Encounter for screening mammogram for malignant neoplasm of breast: Secondary | ICD-10-CM

## 2012-05-26 ENCOUNTER — Other Ambulatory Visit: Payer: Self-pay | Admitting: Nephrology

## 2012-05-26 DIAGNOSIS — N179 Acute kidney failure, unspecified: Secondary | ICD-10-CM

## 2012-05-26 DIAGNOSIS — N189 Chronic kidney disease, unspecified: Secondary | ICD-10-CM

## 2012-05-28 ENCOUNTER — Ambulatory Visit
Admission: RE | Admit: 2012-05-28 | Discharge: 2012-05-28 | Disposition: A | Discharge status: Home / Self Care | Payer: BC Managed Care – PPO | Source: Ambulatory Visit | Attending: Nephrology | Admitting: Nephrology

## 2012-05-28 DIAGNOSIS — N179 Acute kidney failure, unspecified: Secondary | ICD-10-CM

## 2012-05-28 DIAGNOSIS — N189 Chronic kidney disease, unspecified: Secondary | ICD-10-CM

## 2012-06-08 ENCOUNTER — Other Ambulatory Visit: Payer: Self-pay | Admitting: Obstetrics and Gynecology

## 2012-06-08 ENCOUNTER — Other Ambulatory Visit (HOSPITAL_COMMUNITY)
Admission: RE | Admit: 2012-06-08 | Discharge: 2012-06-08 | Disposition: A | Discharge status: Home / Self Care | Payer: BC Managed Care – PPO | Source: Ambulatory Visit | Attending: Obstetrics and Gynecology | Admitting: Obstetrics and Gynecology

## 2012-06-08 DIAGNOSIS — Z01419 Encounter for gynecological examination (general) (routine) without abnormal findings: Secondary | ICD-10-CM | POA: Insufficient documentation

## 2012-12-17 ENCOUNTER — Telehealth: Payer: Self-pay | Admitting: Cardiology

## 2012-12-17 NOTE — Telephone Encounter (Signed)
New Prob   Pt is experiencing rapid heart rate at night time. Currently has appt for 5/13, would like to see if she could get in sooner. Put her on a wait list.

## 2012-12-17 NOTE — Telephone Encounter (Signed)
I do not have anything sooner right now. You may check another provider's schedule to see if another provider could see her sooner and offer that to her.

## 2013-01-14 ENCOUNTER — Encounter: Payer: Self-pay | Admitting: *Deleted

## 2013-01-25 ENCOUNTER — Ambulatory Visit: Payer: BC Managed Care – PPO | Admitting: Cardiology

## 2013-02-08 ENCOUNTER — Encounter: Payer: Self-pay | Admitting: Cardiology

## 2013-03-09 ENCOUNTER — Other Ambulatory Visit (HOSPITAL_COMMUNITY): Payer: Self-pay | Admitting: Internal Medicine

## 2013-03-09 DIAGNOSIS — Z1231 Encounter for screening mammogram for malignant neoplasm of breast: Secondary | ICD-10-CM

## 2013-04-08 ENCOUNTER — Ambulatory Visit (HOSPITAL_COMMUNITY)
Admission: RE | Admit: 2013-04-08 | Discharge: 2013-04-08 | Disposition: A | Discharge status: Home / Self Care | Payer: BC Managed Care – PPO | Source: Ambulatory Visit | Attending: Internal Medicine | Admitting: Internal Medicine

## 2013-04-08 DIAGNOSIS — Z1231 Encounter for screening mammogram for malignant neoplasm of breast: Secondary | ICD-10-CM | POA: Insufficient documentation

## 2013-04-14 ENCOUNTER — Ambulatory Visit: Payer: BC Managed Care – PPO | Admitting: Cardiology

## 2013-04-25 ENCOUNTER — Ambulatory Visit: Payer: BC Managed Care – PPO | Admitting: Cardiology

## 2013-05-31 DIAGNOSIS — N183 Chronic kidney disease, stage 3 unspecified: Secondary | ICD-10-CM | POA: Insufficient documentation

## 2013-05-31 DIAGNOSIS — I10 Essential (primary) hypertension: Secondary | ICD-10-CM | POA: Insufficient documentation

## 2013-05-31 DIAGNOSIS — Z8669 Personal history of other diseases of the nervous system and sense organs: Secondary | ICD-10-CM | POA: Insufficient documentation

## 2013-05-31 DIAGNOSIS — N009 Acute nephritic syndrome with unspecified morphologic changes: Secondary | ICD-10-CM | POA: Insufficient documentation

## 2013-05-31 DIAGNOSIS — Z800 Family history of malignant neoplasm of digestive organs: Secondary | ICD-10-CM | POA: Insufficient documentation

## 2013-05-31 DIAGNOSIS — I150 Renovascular hypertension: Secondary | ICD-10-CM | POA: Insufficient documentation

## 2013-06-10 ENCOUNTER — Other Ambulatory Visit: Payer: Self-pay | Admitting: Obstetrics and Gynecology

## 2013-06-10 ENCOUNTER — Other Ambulatory Visit (HOSPITAL_COMMUNITY)
Admission: RE | Admit: 2013-06-10 | Discharge: 2013-06-10 | Disposition: A | Discharge status: Home / Self Care | Payer: BC Managed Care – PPO | Source: Ambulatory Visit | Attending: Obstetrics and Gynecology | Admitting: Obstetrics and Gynecology

## 2013-06-10 DIAGNOSIS — Z01419 Encounter for gynecological examination (general) (routine) without abnormal findings: Secondary | ICD-10-CM | POA: Insufficient documentation

## 2013-06-10 DIAGNOSIS — Z1151 Encounter for screening for human papillomavirus (HPV): Secondary | ICD-10-CM | POA: Insufficient documentation

## 2013-06-29 DIAGNOSIS — C541 Malignant neoplasm of endometrium: Secondary | ICD-10-CM | POA: Insufficient documentation

## 2013-07-01 DIAGNOSIS — E039 Hypothyroidism, unspecified: Secondary | ICD-10-CM | POA: Insufficient documentation

## 2014-01-13 HISTORY — PX: OTHER SURGICAL HISTORY: SHX169

## 2014-02-02 ENCOUNTER — Encounter: Payer: Self-pay | Admitting: Cardiology

## 2014-02-02 ENCOUNTER — Ambulatory Visit (INDEPENDENT_AMBULATORY_CARE_PROVIDER_SITE_OTHER): Payer: BC Managed Care – PPO | Admitting: Cardiology

## 2014-02-02 ENCOUNTER — Ambulatory Visit: Payer: BC Managed Care – PPO | Admitting: Cardiology

## 2014-02-02 VITALS — BP 162/98 | HR 90 | Ht 64.5 in | Wt 200.5 lb

## 2014-02-02 DIAGNOSIS — I10 Essential (primary) hypertension: Secondary | ICD-10-CM

## 2014-02-02 DIAGNOSIS — I358 Other nonrheumatic aortic valve disorders: Secondary | ICD-10-CM

## 2014-02-02 DIAGNOSIS — R002 Palpitations: Secondary | ICD-10-CM

## 2014-02-02 DIAGNOSIS — R6884 Jaw pain: Secondary | ICD-10-CM

## 2014-02-02 DIAGNOSIS — R008 Other abnormalities of heart beat: Secondary | ICD-10-CM

## 2014-02-02 DIAGNOSIS — R079 Chest pain, unspecified: Secondary | ICD-10-CM

## 2014-02-02 DIAGNOSIS — E785 Hyperlipidemia, unspecified: Secondary | ICD-10-CM

## 2014-02-02 DIAGNOSIS — I359 Nonrheumatic aortic valve disorder, unspecified: Secondary | ICD-10-CM

## 2014-02-02 DIAGNOSIS — I498 Other specified cardiac arrhythmias: Secondary | ICD-10-CM

## 2014-02-02 NOTE — Patient Instructions (Addendum)
WILL TRY TO SCHEDULE FOR TOMORROW 02/02/2014--Your physician has requested that you have an exercise stress myoview. For further information please visit [email or web address]. Please follow instruction sheet, as given.  You will wear for 48 hours  Your physician has recommended that you wear a holter monitor. Holter monitors are medical devices that record the heart's electrical activity. Doctors most often use these monitors to diagnose arrhythmias. Arrhythmias are problems with the speed or rhythm of the heartbeat. The monitor is a small, portable device. You can wear one while you do your normal daily activities. This is usually used to diagnose what is causing palpitations/syncope (passing out).  Your physician has requested that you have an echocardiogram. Echocardiography is a painless test that uses sound waves to create images of your heart. It provides your doctor with information about the size and shape of your heart and how well your heart's chambers and valves are working. This procedure takes approximately one hour. There are no restrictions for this procedure.   Your physician wants you to follow-up in 1 month Dr Ellyn Hack.to go over test results.  You will receive a reminder letter in the mail two months in advance. If you don't receive a letter, please call our office to schedule the follow-up appointment.

## 2014-02-03 ENCOUNTER — Ambulatory Visit (HOSPITAL_BASED_OUTPATIENT_CLINIC_OR_DEPARTMENT_OTHER)
Admission: RE | Admit: 2014-02-03 | Discharge: 2014-02-03 | Disposition: A | Discharge status: Home / Self Care | Payer: BC Managed Care – PPO | Source: Ambulatory Visit | Attending: Cardiovascular Disease | Admitting: Cardiovascular Disease

## 2014-02-03 ENCOUNTER — Ambulatory Visit (HOSPITAL_COMMUNITY)
Admission: RE | Admit: 2014-02-03 | Discharge: 2014-02-03 | Disposition: A | Discharge status: Home / Self Care | Payer: BC Managed Care – PPO | Source: Ambulatory Visit | Attending: Cardiovascular Disease | Admitting: Cardiovascular Disease

## 2014-02-03 DIAGNOSIS — R079 Chest pain, unspecified: Secondary | ICD-10-CM

## 2014-02-03 DIAGNOSIS — R0602 Shortness of breath: Secondary | ICD-10-CM | POA: Insufficient documentation

## 2014-02-03 DIAGNOSIS — R002 Palpitations: Secondary | ICD-10-CM | POA: Insufficient documentation

## 2014-02-03 DIAGNOSIS — R6884 Jaw pain: Secondary | ICD-10-CM

## 2014-02-03 DIAGNOSIS — I358 Other nonrheumatic aortic valve disorders: Secondary | ICD-10-CM

## 2014-02-03 DIAGNOSIS — R5383 Other fatigue: Secondary | ICD-10-CM

## 2014-02-03 DIAGNOSIS — R42 Dizziness and giddiness: Secondary | ICD-10-CM | POA: Insufficient documentation

## 2014-02-03 DIAGNOSIS — R5381 Other malaise: Secondary | ICD-10-CM | POA: Insufficient documentation

## 2014-02-03 DIAGNOSIS — I517 Cardiomegaly: Secondary | ICD-10-CM

## 2014-02-03 HISTORY — PX: NM MYOVIEW LTD: HXRAD82

## 2014-02-03 HISTORY — PX: TRANSTHORACIC ECHOCARDIOGRAM: SHX275

## 2014-02-03 MED ORDER — TECHNETIUM TC 99M SESTAMIBI GENERIC - CARDIOLITE
29.8000 | Freq: Once | INTRAVENOUS | Status: AC | PRN
  Administered 2014-02-03: 29.8 via INTRAVENOUS

## 2014-02-03 MED ORDER — TECHNETIUM TC 99M SESTAMIBI GENERIC - CARDIOLITE
10.9000 | Freq: Once | INTRAVENOUS | Status: AC | PRN
  Administered 2014-02-03: 10.9 via INTRAVENOUS

## 2014-02-03 NOTE — Progress Notes (Signed)
2D Echocardiogram Complete.  02/03/2014   Nussen Pullin, RDCS

## 2014-02-03 NOTE — Procedures (Addendum)
[facility] [address] [address] [place] [address] [phone]  Cardiology Nuclear Med Study  [name] is a 63 y.o. female     MRN : [id number]     DOB: 1950/12/01  Procedure Date: 02/03/2014  Nuclear Med Background Indication for Stress Test:  Evaluation for Ischemia History:  Asthma and aortic murmur;No prior NUC MPI for comparison Cardiac Risk Factors: Family History - CAD, Hypertension, Lipids and Obesity  Symptoms:  Chest Pain, Fatigue, Light-Headedness, Palpitations, SOB and Jaw pain;L arm pain;Nausea   Nuclear Pre-Procedure Caffeine/Decaff Intake:  7:00pm NPO After: 5:00am   IV Site: R Forearm  IV 0.9% NS with Angio Cath:  22g  Chest Size (in):  n/a IV Started by: Rolene Course, RN  Height: 5\' 5"  (1.651 m)  Cup Size: C  BMI:  Body mass index is 33.28 kg/(m^2). Weight:  200 lb (90.719 kg)   Tech Comments:  n/a    Nuclear Med Study 1 or 2 day study: 1 day  Stress Test Type:  Stress  Order Authorizing Provider:  Glenetta Hew, MD   Resting Radionuclide: Technetium 11m Sestamibi  Resting Radionuclide Dose: 10.9 mCi   Stress Radionuclide:  Technetium 16m Sestamibi  Stress Radionuclide Dose: 29.8 mCi           Stress Protocol Rest HR: 82 Stress HR: 162  Rest BP: 174/94 Stress BP: 221/45  Exercise Time (min): 6:51 METS: 7.00   Predicted Max HR: 159 bpm % Max HR: 101.89 bpm Rate Pressure Product: 35802  Dose of Adenosine (mg):  n/a Dose of Lexiscan: n/a mg  Dose of Atropine (mg): n/a Dose of Dobutamine: n/a mcg/kg/min (at max HR)  Stress Test Technologist: Mellody Memos, CCT Nuclear Technologist: Imagene Riches, CNMT   Rest Procedure:  Myocardial perfusion imaging was performed at rest 45 minutes following the intravenous administration of Technetium 49m Sestamibi. Stress Procedure:  The patient performed treadmill exercise using a Bruce  Protocol for 6 minutes and 51 seconds. The patient stopped due to leg  fatigue and shortness of breath. Patient denied any chest pain.  There were significant ST-T wave changes.  Technetium 78m Sestamibi was injected Iv at peak exercise and myocardial perfusion imaging was performed after a brief delay.  Transient Ischemic Dilatation (Normal <1.22):  0.87 Lung/Heart Ratio (Normal <0.45):  0.20 QGS EDV:  89 ml QGS ESV:  36 ml LV Ejection Fraction: 60%     Rest ECG: NSR - Normal EKG  Stress ECG: Significant ST abnormalities consistent with ischemia.  QPS Raw Data Images:  Normal; no motion artifact; normal heart/lung ratio. Stress Images:  Normal homogeneous uptake in all areas of the myocardium. Rest Images:  Normal homogeneous uptake in all areas of the myocardium. Subtraction (SDS):  No evidence of ischemia. LV Wall Motion:  NL LV Function; NL Wall Motion  Impression Exercise Capacity:  Fair exercise capacity. BP Response:  Hypertensive blood pressure response. Clinical Symptoms:  No significant symptoms noted. ECG Impression:  Significant ST abnormalities consistent with ischemia. Comparison with Prior Nuclear Study: No previous nuclear study performed   Overall Impression:  Low risk stress nuclear study with normal perfusion pattern. Probable "false positive" ECG stress test..   Sanda Klein, MD  02/03/2014 1:15 PM

## 2014-02-06 ENCOUNTER — Encounter: Payer: Self-pay | Admitting: Cardiology

## 2014-02-06 DIAGNOSIS — R079 Chest pain, unspecified: Secondary | ICD-10-CM | POA: Insufficient documentation

## 2014-02-06 DIAGNOSIS — E785 Hyperlipidemia, unspecified: Secondary | ICD-10-CM | POA: Insufficient documentation

## 2014-02-06 DIAGNOSIS — I359 Nonrheumatic aortic valve disorder, unspecified: Secondary | ICD-10-CM | POA: Insufficient documentation

## 2014-02-06 DIAGNOSIS — R002 Palpitations: Secondary | ICD-10-CM | POA: Insufficient documentation

## 2014-02-06 DIAGNOSIS — I10 Essential (primary) hypertension: Secondary | ICD-10-CM | POA: Insufficient documentation

## 2014-02-06 NOTE — Progress Notes (Signed)
PATIENT: [name] MRN: [id number] DOB: 05/07/1951 PCP: Leamon Arnt, MD  Clinic Note: Chief Complaint  Patient presents with  . New Evaluation    left arm , left jaw pain while walking last saturday, chest pain, no edema, sob- like I'mholding my breathe,in the morning -nauseas and jumpy  . Chest Pain    HPI: [name] is a 63 y.o. female with a PMH below who presents today for cardiology consultation in response to an episode of chest pain along with exertional dyspnea.  She was originally scheduled to see Dr. Aundra Dubin, but needed to be seen sooner. She has a history of hypertension, hyperlipidemia and edema.  Interval History: She is a somewhat convoluted recent history. These began when she was noted to be somewhat hypotensive and found to be anemic with a hemoglobin that went down from 11.8 to 9.6. She is scheduled to see GI for this, since her guaiac was positive.  She says that from January until May 18 she's been noticing symptoms of heart fluttering is not beating regularly at night. It feels like sharp electrical pulses she also notes that she is not breathing normally at night as if she is "holding her breath." Most concerning thing for her however was this past Saturday when she went to work and noted having significant left sided jaw pain radiating to her arm while walking in to work. She denied any recurrent of that symptom, but has noted fatigue since -- in fact, she noted feeling fatigued all weekend long.. She denies any PND, orthopnea or edema. No right heart rate, just the irregular beats as noted. She's been feeling very fatigued for quite a while now. Very irritable, she thinks is because she is not sleeping well. She has frequent episodes of nausea with vomiting over the course of the day. This occurs using them early part of the day. He does this his. Tired and dragging her body around. She is not sure to do with her medications.  She is referred to me for  her the exertional chest/jaw pain exertional dyspnea and a newly recognized murmur.  The remainder of cardiac review of systems is as follows: Cardiovascular ROS: positive for - chest pain, dyspnea on exertion, irregular heartbeat, murmur and Fatigue negative for - edema, loss of consciousness, orthopnea, paroxysmal nocturnal dyspnea, rapid heart rate or Syncope/near-syncope, TIA/amaurosis fugax, melena, hematochezia, hematuria, epistaxis. No claudication :  Past Medical History  Diagnosis Date  . History of endometrial cancer   . HLD (hyperlipidemia)   . Hypothyroidism     subclinical  . Asthma in adult     mild, intermittent   . CKD (chronic kidney disease), stage III     GFR 30-59  . H/O Guillain-Barre syndrome   . Post-streptococcal glomerulonephritis   . Anemia     Unclear etiology  . Hypertension    Prior Cardiac Evaluation and Past Surgical History: Past Surgical History  Procedure Laterality Date  . Total abdominal hysterectomy w/ bilateral salpingoophorectomy    . Exploratory laparotomy    . Lymphadenectomy      paraaortic and pelvic  . Colonoscopy w/ polypectomy    . Renal biopsy    . Tonsillectomy and adenoidectomy    . Knee arthroscopy      Allergies  Allergen Reactions  . Peanut-Containing Drug Products   . Sulfa Antibiotics     Current Outpatient Prescriptions  Medication Sig Dispense Refill  . aspirin EC 81 MG tablet Take 81 mg by  mouth daily.      Marland Kitchen CARTIA XT 240 MG 24 hr capsule Take 240 capsules by mouth daily.      . furosemide (LASIX) 40 MG tablet Take 40 mg by mouth daily.      Marland Kitchen LIPITOR 10 MG tablet Take 10 mg by mouth at bedtime.       No current facility-administered medications for this visit.    History   Social History Narrative   Single. Self-employed; [employer].   Never smoked. Social EtOH.   Works out at a gym 1-2 days a week for one half hours. She hasn't been able to do her exercise as much as anticipated over the  past year because she has been caring for her parents.  Her father died many of last year, and her mother died in 10/17/2022.  She's been under significant stress in the past year.    family history includes Breast cancer in her maternal aunt; Colon cancer in her father, sister, and another family member; Valvular heart disease in her father.  ROS: A comprehensive Review of Systems - Negative except Extensive symptoms in history of present illness. No arthralgias myalgias, or recent illnesses. As the nausea, fatigue and jitteriness noted above.  Otherwise negative  PHYSICAL EXAM BP 162/98  Pulse 90  Ht 5' 4.5" (1.638 m)  Wt 200 lb 8 oz (90.946 kg)  BMI 33.90 kg/m2 General appearance: alert, cooperative, no distress, mildly obese and  sommeewwhatt sstressed and pressured affect. Well-nourished. Well-groomed HEENT: [place]/AT, EOMI, MMM, anicteric sclera Neck: no adenopathy, no carotid bruit, no JVD and supple, symmetrical, trachea midline Lungs: clear to auscultation bilaterally, normal percussion bilaterally and Nonlabored, good air movement Heart: RRR with normal S1 and S2. Early peaking C.-D. SEM at RUSB, radiates toward but not to the carotids; no other M/R./G. Nondisplaced PMI to Abdomen: soft, non-tender; bowel sounds normal; no masses,  no organomegaly Extremities: extremities normal, atraumatic, no cyanosis or edema and varicose veins noted Pulses: 2+ and symmetric Neurologic: A&O X 3, normal strength and tone. Normal symmetric reflexes. Normal coordination and gait   Adult ECG Report  Rate: 90 ;  Rhythm: normal sinus rhythm; normal axis, intervals, durations.  Narrative Interpretation: Normal EKG   Rect enLabs: May 18  TC 192, TG 181, HDL 60, LDL 94  Iron saturation 9%; TIBC 405, UIBC 367; Iron 38  Sodium 139, potassium 4.6, chloride 105, bicarbonate 26, BUN 24, creatinine 1.25, glucose 93; calcium 9.8; total protein 6.1 creatinine 3.9, bili 0.2, alkaline phosphatase 84, AST/ALT  17/14.  CBC: W5 0.2, H./H9 0.6/32.2, platelet 345  (H&H in October was 11.8/36.7   ASSESSMENT / PLAN: 63 year old woman with cardiac risk factors of hypertension and hyperlipidemia recently diagnosed with mild anemia present is concerning for possible exertional angina along with irregular heartbeat and difficulty sleeping.  Chest pain with moderate risk for cardiac etiology The episode this past Saturday with jaw pain radiating to the left arm in a patient with some cardiac risk factors is concerning for possible angina -- at least moderate risk. Plan: Exercise Myoview/Cardiolite Nuclear Stress Test  Hypertension High blood pressure today. She is only on [place]. She is due to see a nephrologist, who also be adjusting her blood pressure medications. We will see what the results are of an Echocardiogram to assess for need hypertensive heart disease as well as her Myoview to look for ischemia, these results may warrant adjusting her regimen from [place] either to beta blocker plus or minus  additional agents.  Aortic heart murmur Sounds like she has an aortic sclerosis murmur. It is newly developed in the setting of having symptoms of jitteriness and fatigue, we'll go ahead and assess with an echocardiogram to also assess as for signs of hypertensive heart disease. Her father did have history of bowel disease.  PLAN: 2-D echo  Palpitations Her frequent irregular heartbeats and jitteriness are probably related to PVCs or PACs, I don't see any on EKG now. They don't sound like there to witness. Based on the frequency of her symptoms, a 48 hour monitor should be adequate to detect any potential arrhythmias or ectopic beats.    Orders Placed This Encounter  Procedures  . Myocardial Perfusion Imaging    Standing Status: Future     Number of Occurrences: 1     Standing Expiration Date: 02/02/2015    Order Specific Question:  Where should this test be performed    Answer:  [facility]     Order Specific Question:  Type of stress    Answer:  Exercise    Order Specific Question:  Patient weight in lbs    Answer:  200  . EKG 12-Lead  . Holter monitor - 48 hour    Standing Status: Future     Number of Occurrences: 1     Standing Expiration Date: 02/03/2015  . 2D Echocardiogram without contrast    Standing Status: Future     Number of Occurrences: 1     Standing Expiration Date: 02/02/2015    Order Specific Question:  Type of Echo    Answer:  Complete    Order Specific Question:  Where should this test be performed    Answer:  [facility]    Order Specific Question:  Reason for exam-Echo    Answer:  Murmur  785.2    Order Specific Question:  Reason for exam-Echo    Answer:  Chest Pain  786.50    Order Specific Question:  Reason for exam-Echo    Answer:  Abnormal Heart Sounds Nec  785.3    Order Specific Question:  Reason for exam-Echo    Answer:  Chest Pain  786.50    Followup: 1 month    Leonie Man, M.D., M.S. Interventional Cardiologist

## 2014-02-06 NOTE — Assessment & Plan Note (Signed)
Her frequent irregular heartbeats and jitteriness are probably related to PVCs or PACs, I don't see any on EKG now. They don't sound like there to witness. Based on the frequency of her symptoms, a 48 hour monitor should be adequate to detect any potential arrhythmias or ectopic beats.

## 2014-02-06 NOTE — Assessment & Plan Note (Signed)
Sounds like she has an aortic sclerosis murmur. It is newly developed in the setting of having symptoms of jitteriness and fatigue, we'll go ahead and assess with an echocardiogram to also assess as for signs of hypertensive heart disease. Her father did have history of bowel disease.  PLAN: 2-D echo

## 2014-02-06 NOTE — Assessment & Plan Note (Signed)
High blood pressure today. She is only on [place]. She is due to see a nephrologist, who also be adjusting her blood pressure medications. We will see what the results are of an Echocardiogram to assess for need hypertensive heart disease as well as her Myoview to look for ischemia, these results may warrant adjusting her regimen from [place] either to beta blocker plus or minus additional agents.

## 2014-02-06 NOTE — Assessment & Plan Note (Signed)
The episode this past Saturday with jaw pain radiating to the left arm in a patient with some cardiac risk factors is concerning for possible angina -- at least moderate risk. Plan: Exercise Myoview/Cardiolite Nuclear Stress Test

## 2014-02-07 ENCOUNTER — Telehealth: Payer: Self-pay | Admitting: Cardiology

## 2014-02-07 NOTE — Progress Notes (Signed)
Quick Note:  Echo results: Essentially normal pump function and normal valve function. No signs to suggest heart attack. EF: 55-60%. No regional wall motion abnormalities The one abnormal finding is Grade 3 diastolic dysfunction -- suggests very abnormal LV filling during the relaxation phase. This is usually due to long-standing HTN & can be a cause of exertional dyspnea.Leonie Man, MD    ______

## 2014-02-07 NOTE — Progress Notes (Signed)
Quick Note:  Stress Test looked good!! No sign of significant Heart Artery Disease. Pump function is normal. EKG portion was abnormal, but images look good & usually trump the EKG.   Good news!!.  Leonie Man, MD  ______

## 2014-02-07 NOTE — Telephone Encounter (Signed)
Pt says she will be bringing her monitor back,also would like her test results from Friday for her echo and stress test.

## 2014-02-07 NOTE — Telephone Encounter (Signed)
Spoke with patient. Thanked patient for returning heart monitor. Informed patient that her echo & stress test done on Friday 5/22 will need to be read by an physician and then she will receive a call on the results. Patient voiced understanding.

## 2014-02-10 ENCOUNTER — Encounter: Payer: Self-pay | Admitting: Cardiology

## 2014-02-11 NOTE — Progress Notes (Signed)
Quick Note:  Agree with summary of findings on scanned report.  Sinus Rhythm - with intermittent PVCs (Premature Ventricular Contractions) No Arrhythmias.  Mostly benign.  Leonie Man, MD  ______

## 2014-02-17 ENCOUNTER — Encounter: Payer: Self-pay | Admitting: Cardiology

## 2014-02-17 ENCOUNTER — Ambulatory Visit (INDEPENDENT_AMBULATORY_CARE_PROVIDER_SITE_OTHER): Payer: BC Managed Care – PPO | Admitting: Cardiology

## 2014-02-17 VITALS — BP 147/86 | HR 87 | Ht 64.0 in | Wt 200.0 lb

## 2014-02-17 DIAGNOSIS — I5189 Other ill-defined heart diseases: Secondary | ICD-10-CM

## 2014-02-17 DIAGNOSIS — I359 Nonrheumatic aortic valve disorder, unspecified: Secondary | ICD-10-CM

## 2014-02-17 DIAGNOSIS — I5030 Unspecified diastolic (congestive) heart failure: Secondary | ICD-10-CM

## 2014-02-17 DIAGNOSIS — R079 Chest pain, unspecified: Secondary | ICD-10-CM

## 2014-02-17 DIAGNOSIS — I358 Other nonrheumatic aortic valve disorders: Secondary | ICD-10-CM

## 2014-02-17 DIAGNOSIS — R002 Palpitations: Secondary | ICD-10-CM

## 2014-02-17 DIAGNOSIS — I10 Essential (primary) hypertension: Secondary | ICD-10-CM

## 2014-02-17 DIAGNOSIS — I519 Heart disease, unspecified: Secondary | ICD-10-CM

## 2014-02-17 MED ORDER — NEBIVOLOL HCL 2.5 MG PO TABS: 2.5000 mg | ORAL_TABLET | Freq: Every day | ORAL | Status: DC

## 2014-02-17 NOTE — Patient Instructions (Signed)
BYSTOLIC 2.5 MG ONE TABLET A DAILY.  Your physician wants you to follow-up in [place].  You will receive a reminder letter in the mail two months in advance. If you don't receive a letter, please call our office to schedule the follow-up appointment.

## 2014-02-18 ENCOUNTER — Encounter: Payer: Self-pay | Admitting: Cardiology

## 2014-02-18 DIAGNOSIS — I5030 Unspecified diastolic (congestive) heart failure: Secondary | ICD-10-CM | POA: Insufficient documentation

## 2014-02-18 DIAGNOSIS — I5189 Other ill-defined heart diseases: Secondary | ICD-10-CM | POA: Insufficient documentation

## 2014-02-18 DIAGNOSIS — I519 Heart disease, unspecified: Secondary | ICD-10-CM | POA: Insufficient documentation

## 2014-02-18 MED ORDER — NEBIVOLOL HCL 5 MG PO TABS: 5.0000 mg | ORAL_TABLET | Freq: Every day | ORAL | Status: DC

## 2014-02-18 MED ORDER — NEBIVOLOL HCL 2.5 MG PO TABS: 2.5000 mg | ORAL_TABLET | Freq: Every day | ORAL | Status: DC

## 2014-02-18 NOTE — Assessment & Plan Note (Signed)
Relatively benign Myoview from an imaging standpoint.   However, there were EKG changes that may go along more with the LVH findings and diastolic dysfunction than true ischemia given the images being essentially normal.  She very well could have had chest tightness from elevated LV filling pressures that leads to increased wall stress and endocardial ischemia.  Plan: treat hypertension as above.

## 2014-02-18 NOTE — Assessment & Plan Note (Signed)
Minimal sclerosis noted on her air valve and the echo. Otherwise no valvular lesions to explain a soft murmur.

## 2014-02-18 NOTE — Assessment & Plan Note (Signed)
Is quite likely that her diastolic dysfunction explains her exertional dyspnea. Seem like during that spell of time when she is not doing well, her blood pressure during the entire period I do think however this is probably falsely elevated finding from her baseline because she just finished her stress test. However it does show that she has the ability to have reversible grade 3 diastolic function.  With that in mind, after reduction and baseline mild diuretic is reasonable.

## 2014-02-18 NOTE — Progress Notes (Signed)
PATIENT: [name] MRN: [id number] DOB: Oct 09, 1950 PCP: Leamon Arnt, MD  Clinic Note: Chief Complaint  Patient presents with  . Follow-up    02/03/14 had stress test and Echo, wore monitor for 48 hours. no chest pain, swelling or dyspnea.     HPI: [name] is a 63 y.o. female with a PMH below who presents today for follow-up cardiology evaluation in response to an episode of chest pain along with exertional dyspnea.  She was originally scheduled to see Dr. Aundra Dubin, but needed to be seen sooner. She has a history of hypertension, glomerulonephritis,  hyperlipidemia and edema. She is now following up after a treadmill nuclear stress test, echocardiogram and 48 hour monitor.  Interval History:  She says that overall she is generally feeling better. Her blood counts are improved. She is not any more of the jaw tightness chest pain she had before. She still has a little the exertional dyspnea but nothing overly concerning. She basically feels it is now a little bit deconditioned. She does have palpitations so that the relatively insignificant compared to before. Her blood pressure is notably improved.   The remainder of cardiac review of systems is as follows: Cardiovascular ROS: positive for - dyspnea on exertion, murmur, palpitations and Improved fatigue negative for - chest pain, edema, irregular heartbeat, loss of consciousness, orthopnea, paroxysmal nocturnal dyspnea, rapid heart rate, shortness of breath or Syncope/near-syncope, TIA/amaurosis fugax, melena, hematochezia, hematuria, epistaxis. No claudication No significant headaches or blurred vision. :  Past Medical History  Diagnosis Date  . History of endometrial cancer   . HLD (hyperlipidemia)   . Hypothyroidism     subclinical  . Asthma in adult     mild, intermittent   . CKD (chronic kidney disease), stage III     GFR 30-59  . H/O Guillain-Barre syndrome   . Post-streptococcal glomerulonephritis   . Anemia      Unclear etiology  . Hypertension    Prior Cardiac Evaluation History: Procedure Laterality Date  . Nm myoview ltd  02/03/2014    LOW RISK: Exercise 6:51 / 7 METS; + EKG for Ischemia, No CP; Images negative for ischemia or infarction  . Transthoracic echocardiogram  02/03/2014    EF 55-60%. No regional WMA, Gr 3 DD (High filling pressures); Mod LA dilation  . 48 hr holter monitor  01/2014    NSR with intermittent PVCs; no arrhythmia    Allergies  Allergen Reactions  . Peanut-Containing Drug Products   . Sulfa Antibiotics     Current Outpatient Prescriptions  Medication Sig Dispense Refill  . aspirin EC 81 MG tablet Take 81 mg by mouth daily.      Marland Kitchen CARTIA XT 240 MG 24 hr capsule Take 240 capsules by mouth daily.      . furosemide (LASIX) 40 MG tablet Take 40 mg by mouth daily.      Marland Kitchen LIPITOR 10 MG tablet Take 10 mg by mouth at bedtime.      Marland Kitchen omeprazole (PRILOSEC) 40 MG capsule Take 40 mg by mouth 2 (two) times daily.      . nebivolol (BYSTOLIC) 2.5 MG tablet Take 1 tablet (2.5 mg total) by mouth daily.  30 tablet  6  . nebivolol (BYSTOLIC) 5 MG tablet Take 1 tablet (5 mg total) by mouth daily.  14 tablet  0   No current facility-administered medications for this visit.   Her Social and Family History As Been Reviewed in Epic  ROS: A  comprehensive Review of Systems - Negative except Symptoms in history of present illness. No arthralgias myalgias, or recent illnesses. As the nausea, fatigue and jitteriness noted above.  Otherwise negative  PHYSICAL EXAM BP 147/86  Pulse 87  Ht 5\' 4"  (1.626 m)  Wt 200 lb (90.719 kg)  BMI 34.31 kg/m2 General appearance: A&O X 3, cooperative, no distress, mildly obese.. Well-nourished. Well-groomed; more calm and reassured demeanor today. HEENT: [place]/AT, EOMI, MMM, anicteric sclera Neck: no adenopathy, no carotid bruit, no JVD and supple, symmetrical, trachea midline Lungs: CTAB, normal percussion bilaterally and Nonlabored, good air  movement Heart: RRR with normal S1 and S2. Early peaking C.-D. SEM at RUSB, radiates toward but not to the carotids; no other M/R./G. Nondisplaced PMI to Abdomen: soft, NT/ND/NABS; no masses,  no organomegaly Extremities: No C/C/E; mild varicose veins noted Pulses: 2+ and symmetric Neurologic: normal strength and tone. Normal symmetric reflexes. Normal coordination and gait   No recent EKG or labs  48 Hour ASSESSMENT / PLAN: 63 year old woman with cardiac risk factors of hypertension and hyperlipidemia recently diagnosed with mild anemia following up after her stress test, echocardiogram and 48 hour monitor to evaluate chest discomfort and palpitations as well as exertional dyspnea.  Left ventricular diastolic dysfunction with preserved systolic function; Grade 3 Is quite likely that her diastolic dysfunction explains her exertional dyspnea. Seem like during that spell of time when she is not doing well, her blood pressure during the entire period I do think however this is probably falsely elevated finding from her baseline because she just finished her stress test. However it does show that she has the ability to have reversible grade 3 diastolic function.  With that in mind, after reduction and baseline mild diuretic is reasonable.  Hypertension - not adequately controlled She is only on Cartia, outer suspect it will get better with amlodipine. Eventually the plan would be to convert from Cartia to amlodipine provided due to the worsening of edema. She is a beta blocker such as Bystolic (less likely to cause fatigue) for both the negative chronotropic and antihypertensive effect. We are avoiding ACE inhibitors/ARB because of glomerulonephritis.  Plan: Start Bystolic 2.5 mg daily.  She is due to see Dr. Lorrene Reid, from [facility] (her Nephrologist) within a month or so. Target blood pressure range should be in the 782-423 mmHg systolic range. He would probably best to gradually wean down  the Cartia and increase the Bystolic with plans to use amlodipine or floating (dihydropyridine calcium channel blockers) for additional blood pressure control  Continue low-dose Lasix to avoid volume overload. Discussed importance of sliding scale Lasix  Chest pain with moderate risk for cardiac etiology Relatively benign Myoview from an imaging standpoint.   However, there were EKG changes that may go along more with the LVH findings and diastolic dysfunction than true ischemia given the images being essentially normal.  She very well could have had chest tightness from elevated LV filling pressures that leads to increased wall stress and endocardial ischemia.  Plan: treat hypertension as above.  Palpitations Overall improved. PVCs are likely to be better controlled with beta blockers. Reason for one to use of a beta blocker. It shows a Bystolic to avoid concomitant  Fatigue.  Aortic heart murmur Minimal sclerosis noted on her air valve and the echo. Otherwise no valvular lesions to explain a soft murmur.    No orders of the defined types were placed in this encounter.    Followup: 1 month   Leonie Green  [place], M.D., M.S. Interventional Cardiologist   Pager # [phone]  Office # [phone]

## 2014-02-18 NOTE — Assessment & Plan Note (Signed)
Overall improved. PVCs are likely to be better controlled with beta blockers. Reason for one to use of a beta blocker. It shows a Bystolic to avoid concomitant  Fatigue.

## 2014-02-18 NOTE — Assessment & Plan Note (Signed)
She is only on Cartia, outer suspect it will get better with amlodipine. Eventually the plan would be to convert from Cartia to amlodipine provided due to the worsening of edema. She is a beta blocker such as Bystolic (less likely to cause fatigue) for both the negative chronotropic and antihypertensive effect. We are avoiding ACE inhibitors/ARB because of glomerulonephritis.  Plan: Start Bystolic 2.5 mg daily.  She is due to see Dr. Lorrene Reid, from [facility] (her Nephrologist) within a month or so. Target blood pressure range should be in the 892-119 mmHg systolic range. He would probably best to gradually wean down the Cartia and increase the Bystolic with plans to use amlodipine or floating (dihydropyridine calcium channel blockers) for additional blood pressure control  Continue low-dose Lasix to avoid volume overload. Discussed importance of sliding scale Lasix

## 2014-02-22 ENCOUNTER — Other Ambulatory Visit: Payer: Self-pay | Admitting: Gastroenterology

## 2014-02-22 DIAGNOSIS — C180 Malignant neoplasm of cecum: Secondary | ICD-10-CM

## 2014-02-22 DIAGNOSIS — C189 Malignant neoplasm of colon, unspecified: Secondary | ICD-10-CM

## 2014-02-22 HISTORY — DX: Malignant neoplasm of colon, unspecified: C18.9

## 2014-02-27 ENCOUNTER — Other Ambulatory Visit: Payer: Self-pay | Admitting: Dermatology

## 2014-02-28 ENCOUNTER — Ambulatory Visit
Admission: RE | Admit: 2014-02-28 | Discharge: 2014-02-28 | Disposition: A | Discharge status: Home / Self Care | Payer: BC Managed Care – PPO | Source: Ambulatory Visit | Attending: Gastroenterology | Admitting: Gastroenterology

## 2014-02-28 DIAGNOSIS — C180 Malignant neoplasm of cecum: Secondary | ICD-10-CM

## 2014-02-28 MED ORDER — IOHEXOL 300 MG/ML  SOLN
100.0000 mL | Freq: Once | INTRAMUSCULAR | Status: AC | PRN
  Administered 2014-02-28: 100 mL via INTRAVENOUS

## 2014-03-01 ENCOUNTER — Other Ambulatory Visit: Payer: BC Managed Care – PPO

## 2014-03-06 ENCOUNTER — Other Ambulatory Visit: Payer: BC Managed Care – PPO

## 2014-03-06 ENCOUNTER — Other Ambulatory Visit (HOSPITAL_COMMUNITY): Payer: Self-pay | Admitting: Family Medicine

## 2014-03-06 DIAGNOSIS — C189 Malignant neoplasm of colon, unspecified: Secondary | ICD-10-CM | POA: Insufficient documentation

## 2014-03-06 DIAGNOSIS — Z1231 Encounter for screening mammogram for malignant neoplasm of breast: Secondary | ICD-10-CM

## 2014-03-07 ENCOUNTER — Encounter: Payer: Self-pay | Admitting: *Deleted

## 2014-03-09 ENCOUNTER — Ambulatory Visit (INDEPENDENT_AMBULATORY_CARE_PROVIDER_SITE_OTHER): Payer: BC Managed Care – PPO | Admitting: General Surgery

## 2014-03-09 ENCOUNTER — Other Ambulatory Visit: Payer: BC Managed Care – PPO

## 2014-03-13 ENCOUNTER — Ambulatory Visit (INDEPENDENT_AMBULATORY_CARE_PROVIDER_SITE_OTHER): Payer: BC Managed Care – PPO | Admitting: General Surgery

## 2014-03-13 ENCOUNTER — Encounter (INDEPENDENT_AMBULATORY_CARE_PROVIDER_SITE_OTHER): Payer: Self-pay | Admitting: General Surgery

## 2014-03-13 ENCOUNTER — Telehealth: Payer: Self-pay | Admitting: Cardiology

## 2014-03-13 VITALS — BP 136/82 | HR 72 | Temp 98.0°F | Ht 65.0 in | Wt 201.0 lb

## 2014-03-13 DIAGNOSIS — C180 Malignant neoplasm of cecum: Secondary | ICD-10-CM

## 2014-03-13 NOTE — Patient Instructions (Signed)
Plan for lap assisted right colectomy

## 2014-03-13 NOTE — Telephone Encounter (Signed)
Called patient and informed her that we are currently out of Bystolic 5 mg samples. Patient voiced understanding

## 2014-03-13 NOTE — Telephone Encounter (Signed)
Pt would like some samples of Bystolic 5mg please °

## 2014-03-13 NOTE — Progress Notes (Signed)
Patient ID: [name], female   DOB: [birth year], 63 y.o.   MRN: [id number]  Chief Complaint  Patient presents with  . Abdominal Pain    HPI STEPHAIE DARDIS is a 63 y.o. female.  We are asked to see the patient in Consultation by Dr. Amedeo Plenty today  for a cecal cancer. The patient is a 63 year old white female who recently was experiencing significant fatigue and nausea. This all started around [name]. She has had normal bowel movements. She denies any blood in her stool. She went to her medical doctor and was found to be anemic. As part of her workup for the anemia she underwent a colonoscopy and was found to have a tumor in the cecum. This was biopsied and came back as adenocarcinoma. She also had a cardiac workup which was negative. She has undergone a CT scan of her abdomen and pelvis that shows no evidence of disease outside of the colon. She denies any weight loss. HPI  Past Medical History  Diagnosis Date  . History of endometrial cancer   . HLD (hyperlipidemia)   . Hypothyroidism     subclinical  . Asthma in adult     mild, intermittent   . CKD (chronic kidney disease), stage III     GFR 30-59  . H/O Guillain-Barre syndrome   . Post-streptococcal glomerulonephritis   . Anemia     Unclear etiology  . Hypertension     Past Surgical History  Procedure Laterality Date  . Total abdominal hysterectomy w/ bilateral salpingoophorectomy    . Exploratory laparotomy    . Lymphadenectomy      paraaortic and pelvic  . Colonoscopy w/ polypectomy    . Renal biopsy    . Tonsillectomy and adenoidectomy    . Knee arthroscopy    . Nm myoview ltd  02/03/2014    LOW RISK: Exercise 6:51 / 7 METS; + EKG for Ischemia, No CP; Images negative for ischemia or infarction  . Transthoracic echocardiogram  02/03/2014    EF 55-60%. No regional WMA, Gr 3 DD (High filling pressures); Mod LA dilation  . 48 hr holter monitor  01/2014    NSR with intermittent PVCs; no arrhythmia    Family  History  Problem Relation Age of Onset  . Colon cancer Father   . Breast cancer Maternal Aunt   . Colon cancer Sister   . Colon cancer      paternal aunts  . Valvular heart disease Father     Social History History  Substance Use Topics  . Smoking status: Never Smoker   . Smokeless tobacco: Not on file  . Alcohol Use: Yes    Allergies  Allergen Reactions  . Peanut-Containing Drug Products   . Sulfa Antibiotics Rash    Current Outpatient Prescriptions  Medication Sig Dispense Refill  . aspirin EC 81 MG tablet Take 81 mg by mouth daily.      Marland Kitchen CARTIA XT 240 MG 24 hr capsule Take 240 capsules by mouth daily.      . furosemide (LASIX) 40 MG tablet Take 40 mg by mouth daily.      Marland Kitchen LIPITOR 10 MG tablet Take 10 mg by mouth at bedtime.      . nebivolol (BYSTOLIC) 2.5 MG tablet Take 1 tablet (2.5 mg total) by mouth daily.  30 tablet  6  . nebivolol (BYSTOLIC) 5 MG tablet Take 1 tablet (5 mg total) by mouth daily.  14 tablet  0  No current facility-administered medications for this visit.    Review of Systems Review of Systems  Constitutional: Negative.   HENT: Negative.   Eyes: Negative.   Respiratory: Negative.   Cardiovascular: Negative.   Gastrointestinal: Negative.   Endocrine: Negative.   Genitourinary: Negative.   Musculoskeletal: Negative.   Skin: Negative.   Allergic/Immunologic: Negative.   Neurological: Negative.   Hematological: Negative.   Psychiatric/Behavioral: Negative.     Blood pressure 136/82, pulse 72, temperature 98 F (36.7 C), height 5\' 5"  (1.651 m), weight 201 lb (91.173 kg).  Physical Exam Physical Exam  Constitutional: She is oriented to person, place, and time. She appears well-developed and well-nourished.  HENT:  Head: Normocephalic and atraumatic.  Eyes: Conjunctivae and EOM are normal. Pupils are equal, round, and reactive to light.  Neck: Normal range of motion. Neck supple.  Cardiovascular: Normal rate and regular rhythm.    Murmur heard. Pulmonary/Chest: Effort normal and breath sounds normal.  Abdominal: Soft. Bowel sounds are normal.  There is no tenderness. There is no palpable mass.  Musculoskeletal: Normal range of motion.  There is no palpable groin or supraclavicular lymphadenopathy  Lymphadenopathy:    She has no cervical adenopathy.  Neurological: She is alert and oriented to person, place, and time.  Skin: Skin is warm and dry.  Psychiatric: She has a normal mood and affect. Her behavior is normal.    Data Reviewed As above  Assessment    The patient appears to have a cecal cancer with no evidence of obstruction and no evidence of metastatic disease. At this point I think she needs to have a right colectomy. I think she would be a good candidate for laparoscopic-assisted procedure. I've discussed with her in detail the risks and benefits of the operation to do this as well as some of the technical aspects and she understands and wishes to proceed     Plan    Plan for laparoscopic-assisted right colectomy        [name],PAUL S 03/13/2014, 12:31 PM

## 2014-03-30 ENCOUNTER — Other Ambulatory Visit (INDEPENDENT_AMBULATORY_CARE_PROVIDER_SITE_OTHER): Payer: Self-pay | Admitting: General Surgery

## 2014-03-31 ENCOUNTER — Encounter (HOSPITAL_COMMUNITY): Payer: Self-pay | Admitting: Pharmacy Technician

## 2014-03-31 NOTE — Pre-Procedure Instructions (Addendum)
[name]  03/31/2014   Your procedure is scheduled on: Monday, April 10, 2014  Report to [facility] Admitting at 8:00 AM.  Call this number if you have problems the morning of surgery: [phone]   Remember:   Do not eat food or drink liquids after midnight Sunday, April 09, 2014   Take these medicines the morning of surgery with A SIP OF WATER: diltiazem (CARDIZEM CD),   nebivolol (BYSTOLIC) Stop taking Aspirin, Vitamins, and herbal medications. Do not take any NSAIDs ie: Ibuprofen, Advil, Naproxen or any medication containing Aspirin; stop now.  Do not wear jewelry, make-up or nail polish.  Do not wear lotions, powders, or perfumes. You may NOT wear deodorant.  Do not shave 48 hours prior to surgery.  Do not bring valuables to the hospital.  [facility] is not responsible  for any belongings or valuables.               Contacts, dentures or bridgework may not be worn into surgery.  Leave suitcase in the car. After surgery it may be brought to your room.  For patients admitted to the hospital, discharge time is determined by your treatment team.               Patients discharged the day of surgery will not be allowed to drive home.  Name and phone number of your driver:   Special Instructions:  Special Instructions:Special Instructions: [facility] - Preparing for Surgery  Before surgery, you can play an important role.  Because skin is not sterile, your skin needs to be as free of germs as possible.  You can reduce the number of germs on you skin by washing with CHG (chlorahexidine gluconate) soap before surgery.  CHG is an antiseptic cleaner which kills germs and bonds with the skin to continue killing germs even after washing.  Please DO NOT use if you have an allergy to CHG or antibacterial soaps.  If your skin becomes reddened/irritated stop using the CHG and inform your nurse when you arrive at Short Stay.  Do not shave (including legs and underarms) for at  least 48 hours prior to the first CHG shower.  You may shave your face.  Please follow these instructions carefully:   1.  Shower with CHG Soap the night before surgery and the morning of Surgery.  2.  If you choose to wash your hair, wash your hair first as usual with your normal shampoo.  3.  After you shampoo, rinse your hair and body thoroughly to remove the Shampoo.  4.  Use CHG as you would any other liquid soap.  You can apply chg directly  to the skin and wash gently with scrungie or a clean washcloth.  5.  Apply the CHG Soap to your body ONLY FROM THE NECK DOWN.  Do not use on open wounds or open sores.  Avoid contact with your eyes, ears, mouth and genitals (private parts).  Wash genitals (private parts) with your normal soap.  6.  Wash thoroughly, paying special attention to the area where your surgery will be performed.  7.  Thoroughly rinse your body with warm water from the neck down.  8.  DO NOT shower/wash with your normal soap after using and rinsing off the CHG Soap.  9.  Pat yourself dry with a clean towel.            10 .  Wear clean pajamas.  11.  Place clean sheets on your bed the night of your first shower and do not sleep with pets.  Day of Surgery  Do not apply any lotions/deodorants the morning of surgery.  Please wear clean clothes to the hospital/surgery center.   Please read over the following fact sheets that you were given: Pain Booklet, Coughing and Deep Breathing and Surgical Site Infection Prevention

## 2014-04-03 ENCOUNTER — Encounter (HOSPITAL_COMMUNITY)
Admission: RE | Admit: 2014-04-03 | Discharge: 2014-04-03 | Disposition: A | Discharge status: Home / Self Care | Payer: BC Managed Care – PPO | Source: Ambulatory Visit | Attending: General Surgery | Admitting: General Surgery

## 2014-04-03 ENCOUNTER — Encounter (HOSPITAL_COMMUNITY): Payer: Self-pay

## 2014-04-03 ENCOUNTER — Ambulatory Visit (HOSPITAL_COMMUNITY)
Admission: RE | Admit: 2014-04-03 | Discharge: 2014-04-03 | Disposition: A | Discharge status: Home / Self Care | Payer: BC Managed Care – PPO | Source: Ambulatory Visit | Attending: General Surgery | Admitting: General Surgery

## 2014-04-03 DIAGNOSIS — Z01818 Encounter for other preprocedural examination: Secondary | ICD-10-CM | POA: Insufficient documentation

## 2014-04-03 HISTORY — DX: Cardiac murmur, unspecified: R01.1

## 2014-04-03 HISTORY — DX: Malignant (primary) neoplasm, unspecified: C80.1

## 2014-04-03 HISTORY — DX: Gastro-esophageal reflux disease without esophagitis: K21.9

## 2014-04-03 HISTORY — DX: Headache: R51

## 2014-04-03 LAB — CBC
HCT: 31.2 % — ABNORMAL LOW (ref 36.0–46.0)
Hemoglobin: 9.7 g/dL — ABNORMAL LOW (ref 12.0–15.0)
MCH: 25.1 pg — AB (ref 26.0–34.0)
MCHC: 31.1 g/dL (ref 30.0–36.0)
MCV: 80.6 fL (ref 78.0–100.0)
PLATELETS: 331 10*3/uL (ref 150–400)
RBC: 3.87 MIL/uL (ref 3.87–5.11)
RDW: 16.1 % — ABNORMAL HIGH (ref 11.5–15.5)
WBC: 6.7 10*3/uL (ref 4.0–10.5)

## 2014-04-03 LAB — BASIC METABOLIC PANEL
ANION GAP: 14 (ref 5–15)
BUN: 35 mg/dL — ABNORMAL HIGH (ref 6–23)
CO2: 25 meq/L (ref 19–32)
Calcium: 9.7 mg/dL (ref 8.4–10.5)
Chloride: 103 mEq/L (ref 96–112)
Creatinine, Ser: 1.51 mg/dL — ABNORMAL HIGH (ref 0.50–1.10)
GFR calc non Af Amer: 36 mL/min — ABNORMAL LOW (ref >90)
GFR, EST AFRICAN AMERICAN: 42 mL/min — AB (ref >90)
GLUCOSE: 103 mg/dL — AB (ref 70–99)
Potassium: 4.4 mEq/L (ref 3.7–5.3)
SODIUM: 142 meq/L (ref 137–147)

## 2014-04-03 NOTE — Progress Notes (Signed)
Anesthesia Chart Review:  Patient is a 63 year old female scheduled for laparoscopic assisted right colectomy on 04/10/14 by Dr. Marlou Starks.  History includes non-smoker, cecal cancer, iron deficiency anemia due to chronic blood loss, endometrial cancer s/p hysterectomy, CKD stage III, HTN, murmur (minimal aortic valve sclerosis and trivial MR 01/2014 echo), HLD, Guillain-Barre syndrome in the mid-late 1980's, post-streptococcal glomerulonephritis, anemia, GERD, asthma, subclinical hypothyroidism, tonsillectomy, BMI is consistent with obesity.  BMI is 35 consistent with obesity. PCP is Dr. Billey Chang. Nephrologist is Dr. Lorrene Reid.  GI is Dr. Amedeo Plenty.  She was evaluated by cardiologist Dr. Ellyn Hack in 01/2014 for palpitations, chest pain episode, and exertional dyspnea.  See below for test results.  She reports she had a three week period of not feeling well, being fatigues, fluttering, etc which lead her to her PCP who found she was anemic and referred her to GI and cardiology which ultimately lead to the finding of her colon cancer. She has had no further chest pain, significant palpitations, edema, or SOB with her usual activities.  She remains on diuretic and anti-hypertensive therapy.  Treadmill nuclear stress test on 02/03/14 showed: Overall Impression: Low risk stress nuclear study with normal perfusion pattern. Probable "false positive" ECG stress test. (By notes, Dr. Ellyn Hack thought her stress EKG changes may go along more with LVH and diastolic dysfunction rather than true ischemia given the images were essentially normal. He felt her chest tightness in May 2015 may have been from elevated LV filling pressures that lead to increased wall stress and endocardial ischemia."  Echo on 02/03/14 showed: - Left ventricle: The cavity size was normal. Systolic function was normal. The estimated ejection fraction was in the range of 55% to 60%. Wall motion was normal; there were no regional wall motion abnormalities. There  was a reduced contribution of atrial contraction to ventricular filling, due to increased ventricular diastolic pressure or atrial contractile dysfunction. Doppler parameters are consistent with a reversible restrictive pattern, indicative of decreased left ventricular diastolic compliance and/or increased left atrial pressure (grade 3 diastolic dysfunction). - Mitral valve: There was trivial regurgitation. - Left atrium: The atrium was moderately dilated. (Dr. Ellyn Hack felt the grade 3 diastolic dysfunction was "probably falsely elevated finding from her baseline because she just finished her stress test. However it does show that she has the ability to have reversible grade 3 diastolic function.  HTN treatment and mild diuretic therapy was recommended.  EKG on 02/02/14 showed NSR.  48 hour Holter monitor in 01/2014 showed NSR with intermittent PVCs, no arrhythmia.   CXR on 04/03/14 showed: No active cardiopulmonary disease.  Preoperative labs noted.  BUN/Cr 35/1.51 (Patient with known CKD stage III; Cr 1.58 06/29/13 and 1.25 01/30/14). Glucose 103. H/H 9.7/31.2 (previously 9.6/32.2 on 01/30/14), PLT 331K. Plan T&S for the day of surgery. Her H/H are stable since 01/2014.  She is only taking oral iron sporadically.  Will defer decision for transfusion to surgeon and/or anesthesiologist.  I did review cardiac history with anesthesiologist Dr. Conrad Houston.  With recent cardiology follow-up and improvement (no recurrence) of her CV symptoms then it is anticipated that she can proceed as planned.  Remote history of Guilain-Barre syndrome.  Definitive anesthesia plan per her assigned anesthesiologist.   Myra Gianotti, PA-C [facility]/Anesthesiology Phone [phone] 04/03/2014 5:24 PM

## 2014-04-03 NOTE — Anesthesia Preprocedure Evaluation (Addendum)
Anesthesia Evaluation  Patient identified by MRN, date of birth, ID band Patient awake    Reviewed: Allergy & Precautions, H&P , NPO status , Patient's Chart, lab work & pertinent test results, reviewed documented beta blocker date and time   History of Anesthesia Complications Negative for: history of anesthetic complications  Airway Mallampati: I TM Distance: >3 FB Neck ROM: Full    Dental  (+) Teeth Intact, Dental Advisory Given   Pulmonary neg pulmonary ROS,  breath sounds clear to auscultation  Pulmonary exam normal       Cardiovascular hypertension, Pt. on medications and Pt. on home beta blockers - anginaRhythm:Regular Rate:Normal  Nm myoview ltd    02/03/2014       LOW RISK: Exercise 6:51 / 7 METS; + EKG for Ischemia, No CP; Images negative for ischemia or infarction   .  Transthoracic echocardiogram    02/03/2014       EF 55-60%. No regional WMA, Gr 3 DD (High filling pressures); Mod LA dilation   .  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    GI/Hepatic Neg liver ROS, GERD-  Controlled,Colon cancer   Endo/Other  Hypothyroidism (off meds now) Morbid obesity  Renal/GU Renal InsufficiencyRenal disease (creat 1.51)     Musculoskeletal   Abdominal (+) + obese,   Peds  Hematology  (+) Blood dyscrasia (Hb 9.7), anemia ,   Anesthesia Other Findings   Reproductive/Obstetrics                        Anesthesia Physical Anesthesia Plan  ASA: III  Anesthesia Plan: General   Post-op Pain Management:    Induction: Intravenous  Airway Management Planned: Oral ETT  Additional Equipment:   Intra-op Plan:   Post-operative Plan: Extubation in OR  Informed Consent: I have reviewed the patients History and Physical, chart, labs and discussed the procedure including the risks, benefits and alternatives  for the proposed anesthesia with the patient or authorized representative who has indicated his/her understanding and acceptance.   Dental advisory given  Plan Discussed with: CRNA and Surgeon  Anesthesia Plan Comments: (Plan routine monitors, GETA)        Anesthesia Quick Evaluation

## 2014-04-09 MED ORDER — CEFOXITIN SODIUM 2 G IV SOLR
2.0000 g | INTRAVENOUS | Status: AC
  Administered 2014-04-10: 2 g via INTRAVENOUS
  Filled 2014-04-09: qty 2

## 2014-04-10 ENCOUNTER — Encounter (HOSPITAL_COMMUNITY): Payer: Self-pay | Admitting: *Deleted

## 2014-04-10 ENCOUNTER — Inpatient Hospital Stay (HOSPITAL_COMMUNITY): Payer: BC Managed Care – PPO | Admitting: Certified Registered Nurse Anesthetist

## 2014-04-10 ENCOUNTER — Inpatient Hospital Stay (HOSPITAL_COMMUNITY)
Admission: RE | Admit: 2014-04-10 | Discharge: 2014-04-14 | DRG: 331 | Disposition: A | Discharge status: Home / Self Care | Payer: BC Managed Care – PPO | Source: Ambulatory Visit | Attending: General Surgery | Admitting: General Surgery

## 2014-04-10 ENCOUNTER — Encounter (HOSPITAL_COMMUNITY): Payer: BC Managed Care – PPO | Admitting: Vascular Surgery

## 2014-04-10 ENCOUNTER — Ambulatory Visit (HOSPITAL_COMMUNITY): Payer: BC Managed Care – PPO

## 2014-04-10 ENCOUNTER — Encounter (HOSPITAL_COMMUNITY)
Admission: RE | Disposition: A | Discharge status: Home / Self Care | Payer: Self-pay | Source: Ambulatory Visit | Attending: General Surgery

## 2014-04-10 DIAGNOSIS — E039 Hypothyroidism, unspecified: Secondary | ICD-10-CM | POA: Diagnosis present

## 2014-04-10 DIAGNOSIS — C189 Malignant neoplasm of colon, unspecified: Secondary | ICD-10-CM

## 2014-04-10 DIAGNOSIS — Z7982 Long term (current) use of aspirin: Secondary | ICD-10-CM

## 2014-04-10 DIAGNOSIS — E785 Hyperlipidemia, unspecified: Secondary | ICD-10-CM | POA: Diagnosis present

## 2014-04-10 DIAGNOSIS — N183 Chronic kidney disease, stage 3 unspecified: Secondary | ICD-10-CM | POA: Diagnosis present

## 2014-04-10 DIAGNOSIS — D649 Anemia, unspecified: Secondary | ICD-10-CM | POA: Diagnosis present

## 2014-04-10 DIAGNOSIS — C180 Malignant neoplasm of cecum: Principal | ICD-10-CM | POA: Diagnosis present

## 2014-04-10 DIAGNOSIS — I129 Hypertensive chronic kidney disease with stage 1 through stage 4 chronic kidney disease, or unspecified chronic kidney disease: Secondary | ICD-10-CM | POA: Diagnosis present

## 2014-04-10 DIAGNOSIS — Z8542 Personal history of malignant neoplasm of other parts of uterus: Secondary | ICD-10-CM

## 2014-04-10 DIAGNOSIS — Z800 Family history of malignant neoplasm of digestive organs: Secondary | ICD-10-CM

## 2014-04-10 HISTORY — PX: RIGHT COLECTOMY: SHX853

## 2014-04-10 HISTORY — PX: LAPAROSCOPIC RIGHT COLECTOMY: SHX5925

## 2014-04-10 LAB — TYPE AND SCREEN
ABO/RH(D): O POS
ANTIBODY SCREEN: NEGATIVE

## 2014-04-10 LAB — ABO/RH: ABO/RH(D): O POS

## 2014-04-10 SURGERY — COLECTOMY, RIGHT, LAPAROSCOPIC
Anesthesia: General | Site: Abdomen | Laterality: Right

## 2014-04-10 MED ORDER — ROCURONIUM BROMIDE 100 MG/10ML IV SOLN
INTRAVENOUS | Status: DC | PRN
  Administered 2014-04-10 (×2): 10 mg via INTRAVENOUS
  Administered 2014-04-10: 50 mg via INTRAVENOUS

## 2014-04-10 MED ORDER — OXYCODONE HCL 5 MG PO TABS
ORAL_TABLET | ORAL | Status: AC
  Filled 2014-04-10: qty 1

## 2014-04-10 MED ORDER — OXYCODONE HCL 5 MG/5ML PO SOLN: 5.0000 mg | Freq: Once | ORAL | Status: AC | PRN

## 2014-04-10 MED ORDER — SODIUM CHLORIDE 0.9 % IR SOLN
Status: DC | PRN
  Administered 2014-04-10: 1000 mL

## 2014-04-10 MED ORDER — HYDROMORPHONE HCL PF 1 MG/ML IJ SOLN
INTRAMUSCULAR | Status: AC
  Filled 2014-04-10: qty 1

## 2014-04-10 MED ORDER — CHLORHEXIDINE GLUCONATE 4 % EX LIQD
1.0000 "application " | Freq: Once | CUTANEOUS | Status: DC
  Filled 2014-04-10: qty 15

## 2014-04-10 MED ORDER — PROMETHAZINE HCL 25 MG/ML IJ SOLN
6.2500 mg | INTRAMUSCULAR | Status: DC | PRN
  Administered 2014-04-10: 6.25 mg via INTRAVENOUS

## 2014-04-10 MED ORDER — NEOSTIGMINE METHYLSULFATE 10 MG/10ML IV SOLN
INTRAVENOUS | Status: DC | PRN
  Administered 2014-04-10: 4 mg via INTRAVENOUS

## 2014-04-10 MED ORDER — DILTIAZEM HCL ER COATED BEADS 240 MG PO CP24
240.0000 mg | ORAL_CAPSULE | Freq: Every day | ORAL | Status: DC
  Administered 2014-04-11 – 2014-04-14 (×4): 240 mg via ORAL
  Filled 2014-04-10 (×5): qty 1

## 2014-04-10 MED ORDER — FENTANYL CITRATE 0.05 MG/ML IJ SOLN
INTRAMUSCULAR | Status: AC
  Filled 2014-04-10: qty 5

## 2014-04-10 MED ORDER — LIDOCAINE HCL (CARDIAC) 20 MG/ML IV SOLN
INTRAVENOUS | Status: DC | PRN
  Administered 2014-04-10: 100 mg via INTRAVENOUS

## 2014-04-10 MED ORDER — NEBIVOLOL HCL 2.5 MG PO TABS
2.5000 mg | ORAL_TABLET | Freq: Every day | ORAL | Status: DC
  Administered 2014-04-10 – 2014-04-13 (×4): 2.5 mg via ORAL
  Filled 2014-04-10 (×5): qty 1

## 2014-04-10 MED ORDER — ONDANSETRON HCL 4 MG PO TABS: 4.0000 mg | ORAL_TABLET | Freq: Four times a day (QID) | ORAL | Status: DC | PRN

## 2014-04-10 MED ORDER — KCL IN DEXTROSE-NACL 20-5-0.9 MEQ/L-%-% IV SOLN
INTRAVENOUS | Status: DC
  Administered 2014-04-10 – 2014-04-12 (×7): via INTRAVENOUS
  Filled 2014-04-10 (×6): qty 1000

## 2014-04-10 MED ORDER — PROPOFOL 10 MG/ML IV BOLUS
INTRAVENOUS | Status: DC | PRN
  Administered 2014-04-10: 200 mg via INTRAVENOUS

## 2014-04-10 MED ORDER — HYDROMORPHONE HCL PF 1 MG/ML IJ SOLN
0.2500 mg | INTRAMUSCULAR | Status: DC | PRN
  Administered 2014-04-10 (×2): 0.5 mg via INTRAVENOUS

## 2014-04-10 MED ORDER — FENTANYL CITRATE 0.05 MG/ML IJ SOLN
INTRAMUSCULAR | Status: DC | PRN
  Administered 2014-04-10 (×2): 50 ug via INTRAVENOUS
  Administered 2014-04-10 (×2): 100 ug via INTRAVENOUS
  Administered 2014-04-10: 50 ug via INTRAVENOUS

## 2014-04-10 MED ORDER — ALVIMOPAN 12 MG PO CAPS
12.0000 mg | ORAL_CAPSULE | Freq: Once | ORAL | Status: AC
  Administered 2014-04-10: 12 mg via ORAL
  Filled 2014-04-10 (×2): qty 1

## 2014-04-10 MED ORDER — LACTATED RINGERS IV SOLN
INTRAVENOUS | Status: DC
  Administered 2014-04-10: 09:00:00 via INTRAVENOUS

## 2014-04-10 MED ORDER — MIDAZOLAM HCL 2 MG/2ML IJ SOLN
INTRAMUSCULAR | Status: AC
  Filled 2014-04-10: qty 2

## 2014-04-10 MED ORDER — ONDANSETRON HCL 4 MG/2ML IJ SOLN: 4.0000 mg | Freq: Four times a day (QID) | INTRAMUSCULAR | Status: DC | PRN

## 2014-04-10 MED ORDER — PROMETHAZINE HCL 25 MG/ML IJ SOLN
INTRAMUSCULAR | Status: AC
  Filled 2014-04-10: qty 1

## 2014-04-10 MED ORDER — GLYCOPYRROLATE 0.2 MG/ML IJ SOLN
INTRAMUSCULAR | Status: DC | PRN
  Administered 2014-04-10: .6 mg via INTRAVENOUS

## 2014-04-10 MED ORDER — BUPIVACAINE-EPINEPHRINE 0.25% -1:200000 IJ SOLN
INTRAMUSCULAR | Status: DC | PRN
  Administered 2014-04-10: 30 mL

## 2014-04-10 MED ORDER — MORPHINE SULFATE 4 MG/ML IJ SOLN
4.0000 mg | INTRAMUSCULAR | Status: DC | PRN
  Administered 2014-04-10 – 2014-04-11 (×6): 4 mg via INTRAVENOUS
  Filled 2014-04-10 (×7): qty 1

## 2014-04-10 MED ORDER — 0.9 % SODIUM CHLORIDE (POUR BTL) OPTIME
TOPICAL | Status: DC | PRN
  Administered 2014-04-10 (×3): 1000 mL

## 2014-04-10 MED ORDER — PHENYLEPHRINE HCL 10 MG/ML IJ SOLN
INTRAMUSCULAR | Status: DC | PRN
  Administered 2014-04-10: 80 ug via INTRAVENOUS

## 2014-04-10 MED ORDER — MEPERIDINE HCL 25 MG/ML IJ SOLN: 6.2500 mg | INTRAMUSCULAR | Status: DC | PRN

## 2014-04-10 MED ORDER — ALVIMOPAN 12 MG PO CAPS
12.0000 mg | ORAL_CAPSULE | Freq: Two times a day (BID) | ORAL | Status: DC
  Administered 2014-04-11 – 2014-04-12 (×4): 12 mg via ORAL
  Filled 2014-04-10 (×8): qty 1

## 2014-04-10 MED ORDER — PROPOFOL 10 MG/ML IV BOLUS
INTRAVENOUS | Status: AC
  Filled 2014-04-10: qty 20

## 2014-04-10 MED ORDER — HEPARIN SODIUM (PORCINE) 5000 UNIT/ML IJ SOLN
5000.0000 [IU] | Freq: Three times a day (TID) | INTRAMUSCULAR | Status: DC
  Administered 2014-04-11 – 2014-04-14 (×10): 5000 [IU] via SUBCUTANEOUS
  Filled 2014-04-10 (×15): qty 1

## 2014-04-10 MED ORDER — MIDAZOLAM HCL 5 MG/5ML IJ SOLN
INTRAMUSCULAR | Status: DC | PRN
  Administered 2014-04-10: 2 mg via INTRAVENOUS

## 2014-04-10 MED ORDER — BUPIVACAINE-EPINEPHRINE (PF) 0.25% -1:200000 IJ SOLN
INTRAMUSCULAR | Status: AC
  Filled 2014-04-10: qty 30

## 2014-04-10 MED ORDER — LACTATED RINGERS IV SOLN
INTRAVENOUS | Status: DC | PRN
  Administered 2014-04-10 (×2): via INTRAVENOUS

## 2014-04-10 MED ORDER — MIDAZOLAM HCL 2 MG/2ML IJ SOLN: 0.5000 mg | Freq: Once | INTRAMUSCULAR | Status: DC | PRN

## 2014-04-10 MED ORDER — POVIDONE-IODINE 10 % EX OINT
TOPICAL_OINTMENT | CUTANEOUS | Status: AC
  Filled 2014-04-10: qty 28.35

## 2014-04-10 MED ORDER — DEXAMETHASONE SODIUM PHOSPHATE 4 MG/ML IJ SOLN
INTRAMUSCULAR | Status: DC | PRN
  Administered 2014-04-10: 4 mg via INTRAVENOUS

## 2014-04-10 MED ORDER — FENTANYL CITRATE 0.05 MG/ML IJ SOLN
INTRAMUSCULAR | Status: AC
Start: 2014-04-10 — End: 2014-04-10
  Filled 2014-04-10: qty 5

## 2014-04-10 MED ORDER — ONDANSETRON HCL 4 MG/2ML IJ SOLN
INTRAMUSCULAR | Status: DC | PRN
  Administered 2014-04-10: 4 mg via INTRAVENOUS

## 2014-04-10 MED ORDER — OXYCODONE HCL 5 MG PO TABS
5.0000 mg | ORAL_TABLET | Freq: Once | ORAL | Status: AC | PRN
  Administered 2014-04-10: 5 mg via ORAL

## 2014-04-10 MED ORDER — FUROSEMIDE 40 MG PO TABS
40.0000 mg | ORAL_TABLET | Freq: Every day | ORAL | Status: DC
  Administered 2014-04-11 – 2014-04-14 (×3): 40 mg via ORAL
  Filled 2014-04-10 (×5): qty 1

## 2014-04-10 SURGICAL SUPPLY — 71 items
APPLIER CLIP ROT 10 11.4 M/L (STAPLE)
BLADE SURG ROTATE 9660 (MISCELLANEOUS) ×3 IMPLANT
CANISTER SUCTION 2500CC (MISCELLANEOUS) ×3 IMPLANT
CELLS DAT CNTRL 66122 CELL SVR (MISCELLANEOUS) ×1 IMPLANT
CHLORAPREP W/TINT 26ML (MISCELLANEOUS) ×3 IMPLANT
CLIP APPLIE ROT 10 11.4 M/L (STAPLE) IMPLANT
COVER SURGICAL LIGHT HANDLE (MISCELLANEOUS) ×3 IMPLANT
DECANTER SPIKE VIAL GLASS SM (MISCELLANEOUS) ×3 IMPLANT
DERMABOND ADVANCED (GAUZE/BANDAGES/DRESSINGS)
DERMABOND ADVANCED .7 DNX12 (GAUZE/BANDAGES/DRESSINGS) IMPLANT
DRAPE PROXIMA HALF (DRAPES) ×3 IMPLANT
DRAPE UTILITY 15X26 W/TAPE STR (DRAPE) ×6 IMPLANT
DRAPE WARM FLUID 44X44 (DRAPE) ×3 IMPLANT
DRSG OPSITE POSTOP 4X8 (GAUZE/BANDAGES/DRESSINGS) ×3 IMPLANT
DRSG TEGADERM 2-3/8X2-3/4 SM (GAUZE/BANDAGES/DRESSINGS) ×3 IMPLANT
ELECT CAUTERY BLADE 6.4 (BLADE) ×6 IMPLANT
ELECT REM PT RETURN 9FT ADLT (ELECTROSURGICAL) ×3
ELECTRODE REM PT RTRN 9FT ADLT (ELECTROSURGICAL) ×1 IMPLANT
GAUZE SPONGE 2X2 8PLY STRL LF (GAUZE/BANDAGES/DRESSINGS) ×1 IMPLANT
GLOVE BIO SURGEON STRL SZ7 (GLOVE) ×9 IMPLANT
GLOVE BIO SURGEON STRL SZ7.5 (GLOVE) ×6 IMPLANT
GLOVE BIOGEL PI IND STRL 7.0 (GLOVE) ×2 IMPLANT
GLOVE BIOGEL PI IND STRL 7.5 (GLOVE) ×1 IMPLANT
GLOVE BIOGEL PI INDICATOR 7.0 (GLOVE) ×4
GLOVE BIOGEL PI INDICATOR 7.5 (GLOVE) ×2
GLOVE ECLIPSE 7.5 STRL STRAW (GLOVE) ×6 IMPLANT
GOWN STRL REUS W/ TWL LRG LVL3 (GOWN DISPOSABLE) ×6 IMPLANT
GOWN STRL REUS W/TWL LRG LVL3 (GOWN DISPOSABLE) ×12
KIT BASIN OR (CUSTOM PROCEDURE TRAY) ×3 IMPLANT
KIT ROOM TURNOVER OR (KITS) ×3 IMPLANT
LIGASURE IMPACT 36 18CM CVD LR (INSTRUMENTS) ×6 IMPLANT
NS IRRIG 1000ML POUR BTL (IV SOLUTION) ×6 IMPLANT
PAD ARMBOARD 7.5X6 YLW CONV (MISCELLANEOUS) ×6 IMPLANT
PENCIL BUTTON HOLSTER BLD 10FT (ELECTRODE) ×6 IMPLANT
RELOAD PROXIMATE 75MM BLUE (ENDOMECHANICALS) ×6 IMPLANT
RTRCTR WOUND ALEXIS 18CM MED (MISCELLANEOUS) ×3
SCALPEL HARMONIC ACE (MISCELLANEOUS) ×3 IMPLANT
SCISSORS LAP 5X35 DISP (ENDOMECHANICALS) IMPLANT
SET IRRIG TUBING LAPAROSCOPIC (IRRIGATION / IRRIGATOR) ×3 IMPLANT
SLEEVE ENDOPATH XCEL 5M (ENDOMECHANICALS) ×6 IMPLANT
SPECIMEN JAR LARGE (MISCELLANEOUS) ×3 IMPLANT
SPONGE GAUZE 2X2 STER 10/PKG (GAUZE/BANDAGES/DRESSINGS) ×2
SPONGE GAUZE 4X4 12PLY (GAUZE/BANDAGES/DRESSINGS) ×3 IMPLANT
SPONGE LAP 18X18 X RAY DECT (DISPOSABLE) ×3 IMPLANT
STAPLER GUN LINEAR PROX 60 (STAPLE) ×3 IMPLANT
STAPLER PROXIMATE 75MM BLUE (STAPLE) ×3 IMPLANT
STAPLER VISISTAT 35W (STAPLE) ×3 IMPLANT
SUCTION POOLE TIP (SUCTIONS) ×3 IMPLANT
SUT PDS AB 1 CT  36 (SUTURE) ×4
SUT PDS AB 1 CT 36 (SUTURE) ×2 IMPLANT
SUT PDS AB 1 TP1 96 (SUTURE) ×6 IMPLANT
SUT SILK 2 0 (SUTURE) ×2
SUT SILK 2 0 SH CR/8 (SUTURE) ×3 IMPLANT
SUT SILK 2-0 18XBRD TIE 12 (SUTURE) ×1 IMPLANT
SUT SILK 3 0 (SUTURE) ×2
SUT SILK 3 0 SH CR/8 (SUTURE) ×6 IMPLANT
SUT SILK 3-0 18XBRD TIE 12 (SUTURE) ×1 IMPLANT
SYS LAPSCP GELPORT 120MM (MISCELLANEOUS)
SYSTEM LAPSCP GELPORT 120MM (MISCELLANEOUS) IMPLANT
TOWEL OR 17X24 6PK STRL BLUE (TOWEL DISPOSABLE) ×3 IMPLANT
TOWEL OR 17X26 10 PK STRL BLUE (TOWEL DISPOSABLE) ×3 IMPLANT
TRAY FOLEY CATH 14FRSI W/METER (CATHETERS) ×3 IMPLANT
TRAY LAPAROSCOPIC (CUSTOM PROCEDURE TRAY) ×3 IMPLANT
TROCAR XCEL 12X100 BLDLESS (ENDOMECHANICALS) IMPLANT
TROCAR XCEL BLUNT TIP 100MML (ENDOMECHANICALS) ×3 IMPLANT
TROCAR XCEL NON-BLD 11X100MML (ENDOMECHANICALS) IMPLANT
TROCAR XCEL NON-BLD 5MMX100MML (ENDOMECHANICALS) ×3 IMPLANT
TUBE CONNECTING 12'X1/4 (SUCTIONS) ×2
TUBE CONNECTING 12X1/4 (SUCTIONS) ×4 IMPLANT
WATER STERILE IRR 1000ML POUR (IV SOLUTION) IMPLANT
YANKAUER SUCT BULB TIP NO VENT (SUCTIONS) ×6 IMPLANT

## 2014-04-10 NOTE — Anesthesia Postprocedure Evaluation (Signed)
  Anesthesia Post-op Note  Patient: [name]  Procedure(s) Performed: Procedure(s): LAPAROSCOPIC ASSISTED  RIGHT COLECTOMY (Right)  Patient Location: PACU  Anesthesia Type:General  Level of Consciousness: awake, alert , oriented and patient cooperative  Airway and Oxygen Therapy: Patient Spontanous Breathing and Patient connected to nasal cannula oxygen  Post-op Pain: none  Post-op Assessment: Post-op Vital signs reviewed, Patient's Cardiovascular Status Stable, Respiratory Function Stable, Patent Airway, No signs of Nausea or vomiting and Pain level controlled  Post-op Vital Signs: Reviewed and stable  Last Vitals:  Filed Vitals:   04/10/14 1309  BP:   Pulse: 70  Temp:   Resp: 12    Complications: No apparent anesthesia complications

## 2014-04-10 NOTE — H&P (View-Only) (Signed)
Patient ID: [name], female   DOB: 11-11-1950, 63 y.o.   MRN: [id number]  Chief Complaint  Patient presents with  . Abdominal Pain    HPI [name] is a 63 y.o. female.  We are asked to see the patient in Consultation by Dr. Amedeo Plenty today  for a cecal cancer. The patient is a 64 year old white female who recently was experiencing significant fatigue and nausea. This all started around [place] day. She has had normal bowel movements. She denies any blood in her stool. She went to her medical doctor and was found to be anemic. As part of her workup for the anemia she underwent a colonoscopy and was found to have a tumor in the cecum. This was biopsied and came back as adenocarcinoma. She also had a cardiac workup which was negative. She has undergone a CT scan of her abdomen and pelvis that shows no evidence of disease outside of the colon. She denies any weight loss. HPI  Past Medical History  Diagnosis Date  . History of endometrial cancer   . HLD (hyperlipidemia)   . Hypothyroidism     subclinical  . Asthma in adult     mild, intermittent   . CKD (chronic kidney disease), stage III     GFR 30-59  . H/O Guillain-Barre syndrome   . Post-streptococcal glomerulonephritis   . Anemia     Unclear etiology  . Hypertension     Past Surgical History  Procedure Laterality Date  . Total abdominal hysterectomy w/ bilateral salpingoophorectomy    . Exploratory laparotomy    . Lymphadenectomy      paraaortic and pelvic  . Colonoscopy w/ polypectomy    . Renal biopsy    . Tonsillectomy and adenoidectomy    . Knee arthroscopy    . Nm myoview ltd  02/03/2014    LOW RISK: Exercise 6:51 / 7 METS; + EKG for Ischemia, No CP; Images negative for ischemia or infarction  . Transthoracic echocardiogram  02/03/2014    EF 55-60%. No regional WMA, Gr 3 DD (High filling pressures); Mod LA dilation  . 48 hr holter monitor  01/2014    NSR with intermittent PVCs; no arrhythmia    Family  History  Problem Relation Age of Onset  . Colon cancer Father   . Breast cancer Maternal Aunt   . Colon cancer Sister   . Colon cancer      paternal aunts  . Valvular heart disease Father     Social History History  Substance Use Topics  . Smoking status: Never Smoker   . Smokeless tobacco: Not on file  . Alcohol Use: Yes    Allergies  Allergen Reactions  . Peanut-Containing Drug Products   . Sulfa Antibiotics Rash    Current Outpatient Prescriptions  Medication Sig Dispense Refill  . aspirin EC 81 MG tablet Take 81 mg by mouth daily.      Marland Kitchen CARTIA XT 240 MG 24 hr capsule Take 240 capsules by mouth daily.      . furosemide (LASIX) 40 MG tablet Take 40 mg by mouth daily.      Marland Kitchen LIPITOR 10 MG tablet Take 10 mg by mouth at bedtime.      . nebivolol (BYSTOLIC) 2.5 MG tablet Take 1 tablet (2.5 mg total) by mouth daily.  30 tablet  6  . nebivolol (BYSTOLIC) 5 MG tablet Take 1 tablet (5 mg total) by mouth daily.  14 tablet  0  No current facility-administered medications for this visit.    Review of Systems Review of Systems  Constitutional: Negative.   HENT: Negative.   Eyes: Negative.   Respiratory: Negative.   Cardiovascular: Negative.   Gastrointestinal: Negative.   Endocrine: Negative.   Genitourinary: Negative.   Musculoskeletal: Negative.   Skin: Negative.   Allergic/Immunologic: Negative.   Neurological: Negative.   Hematological: Negative.   Psychiatric/Behavioral: Negative.     Blood pressure 136/82, pulse 72, temperature 98 F (36.7 C), height 5\' 5"  (1.651 m), weight 201 lb (91.173 kg).  Physical Exam Physical Exam  Constitutional: She is oriented to person, place, and time. She appears well-developed and well-nourished.  HENT:  Head: Normocephalic and atraumatic.  Eyes: Conjunctivae and EOM are normal. Pupils are equal, round, and reactive to light.  Neck: Normal range of motion. Neck supple.  Cardiovascular: Normal rate and regular rhythm.    Murmur heard. Pulmonary/Chest: Effort normal and breath sounds normal.  Abdominal: Soft. Bowel sounds are normal.  There is no tenderness. There is no palpable mass.  Musculoskeletal: Normal range of motion.  There is no palpable groin or supraclavicular lymphadenopathy  Lymphadenopathy:    She has no cervical adenopathy.  Neurological: She is alert and oriented to person, place, and time.  Skin: Skin is warm and dry.  Psychiatric: She has a normal mood and affect. Her behavior is normal.    Data Reviewed As above  Assessment    The patient appears to have a cecal cancer with no evidence of obstruction and no evidence of metastatic disease. At this point I think she needs to have a right colectomy. I think she would be a good candidate for laparoscopic-assisted procedure. I've discussed with her in detail the risks and benefits of the operation to do this as well as some of the technical aspects and she understands and wishes to proceed     Plan    Plan for laparoscopic-assisted right colectomy        [name],PAUL S 03/13/2014, 12:31 PM

## 2014-04-10 NOTE — Transfer of Care (Signed)
Immediate Anesthesia Transfer of Care Note  Patient: [name]  Procedure(s) Performed: Procedure(s): LAPAROSCOPIC ASSISTED  RIGHT COLECTOMY (Right)  Patient Location: PACU  Anesthesia Type:General  Level of Consciousness: awake, alert , patient cooperative and responds to stimulation  Airway & Oxygen Therapy: Patient Spontanous Breathing and Patient connected to nasal cannula oxygen  Post-op Assessment: Report given to PACU RN and Post -op Vital signs reviewed and stable  Post vital signs: Reviewed and stable  Complications: No apparent anesthesia complications

## 2014-04-10 NOTE — Interval H&P Note (Signed)
History and Physical Interval Note:  04/10/2014 8:49 AM  [name]  has presented today for surgery, with the diagnosis of right colon cancer  The various methods of treatment have been discussed with the patient and family. After consideration of risks, benefits and other options for treatment, the patient has consented to  Procedure(s): LAPAROSCOPIC ASSISTED  RIGHT COLECTOMY (Right) as a surgical intervention .  The patient's history has been reviewed, patient examined, no change in status, stable for surgery.  I have reviewed the patient's chart and labs.  Questions were answered to the patient's satisfaction.     [name],Sunita Demond S

## 2014-04-10 NOTE — Op Note (Signed)
04/10/2014  11:32 AM  PATIENT:  [name]  63 y.o. female  PRE-OPERATIVE DIAGNOSIS:  right colon cancer  POST-OPERATIVE DIAGNOSIS:  right colon cancer  PROCEDURE:  Procedure(s): LAPAROSCOPIC ASSISTED  RIGHT COLECTOMY (Right)  SURGEON:  Surgeon(s) and Role:    * Merrie Roof, MD - Primary  PHYSICIAN ASSISTANT:   ASSISTANTS: Judyann Munson, RNFA   ANESTHESIA:   general  EBL:  Total I/O In: 1000 [I.V.:1000] Out: 200 [Urine:200]  BLOOD ADMINISTERED:none  DRAINS: none   LOCAL MEDICATIONS USED:  MARCAINE     SPECIMEN:  Source of Specimen:  terminal ileum and right colon  DISPOSITION OF SPECIMEN:  PATHOLOGY  COUNTS:  YES  TOURNIQUET:  * No tourniquets in log *  DICTATION: .Dragon Dictation After informed consent was obtained patient brought to the operating room and placed in supine position on the operating table. After adequate induction of general anesthesia the patient's abdomen was prepped with ChloraPrep, dry, and draped in usual sterile manner. A site was chosen in the left upper quadrant for placement of a 5 mm port. This area was infiltrated with quarter percent Marcaine and a small incision was made with a 15 blade knife. A 5 mm Optiview port was then used to bluntly dissect through the layers of the abdominal wall under direct vision until access was gained to the abdominal cavity. The abdomen was then insufflated with carbon dioxide without difficulty. The abdomen was generally inspected. The surfaces were all clean. The liver looked normal. 2 other 5 mm ports were placed under direct vision on the left abdomen. Next the right colon was able to be mobilized by incising its retroperitoneal attachments along the white line of Toldt. The transverse colon was also able to be mobilized away from the stomach duodenum and gallbladder. This dissection was also done with the Harmonic scalpel. Once this was accomplished the terminal ileum and right colon and transverse  colon appeared to be very mobile. I then made a small midline incision just above the umbilicus with a 10 blade knife. This incision was carried through the skin and subcutaneous tissue sharply with the electrocautery until the linea alba was identified. The linea alba was also incised with the electrocautery. The preperitoneal space was probed bluntly with the hemostat until axis was gained of the abdominal cavity. The rest of the incision was then opened under direct vision. A wound protector was deployed. The terminal ileum right and transverse colon were then able to be brought up through this opening. A site was chosen along the proximal transverse colon where there was good blood supply for division of the colon. The mesentery at this point was opened sharply with the electrocautery. A GIA 75 stapler was placed across the colon at this point clamped and fired thereby dividing the colon between staple lines. The site was also chosen on the terminal ileum for division of the small bowel. The mesentery at this point was opened sharply with the electrocautery. A GIA 75 stapler was placed across the small bowel this point clamped and fired thereby dividing the small bowel between staple lines. The mesentery to the right colon was then taken down with the LigaSure. The main vessel to the right colon was then clamped with a Kelly clamp, divided with the LigaSure, and then controlled with a 2-0 silk suture ligature. Once this was accomplished the terminal ileum and right colon were removed and sent to pathology for further evaluation. The operative area appeared very hemostatic.  The small bowel and transverse colon readily approximated each other without tension. A small opening was made on the antimesenteric border of the terminal ileum and transverse colon. Each limb of a GIA 75 stapler was placed down the appropriate limb of small bowel or colon, clamped, and fired thereby creating a nice widely patent  enteroenterostomy. The common opening was closed with a TA 60 stapler. The staple line was then reinforced with multiple interrupted 3-0 silk Lembert stitches including a crotch stitch. The mesentery between the small bowel and transverse colon were separated too far to close the mesenteric defect. The anastomosis was then placed back in the right upper quadrant. All gowns and gloves were changed and new drapes were applied. The ends and irrigated with copious amounts of saline. The operative bed was examined again and found to be hemostatic. The anastomosis looked good. At this point the ports were removed. The fascia of the anterior abdominal wall was closed with 2 running #1 double-stranded looped PDS sutures. Subcutaneous tissue was irrigated with copious amounts of saline. The skin was then closed with staples. Sterile dressings were applied. The patient tolerated the procedure well. At the end of the case on needle sponge and instrument counts were correct. The patient was then awakened and taken to recovery in stable condition.  PLAN OF CARE: Admit to inpatient   PATIENT DISPOSITION:  PACU - hemodynamically stable.   Delay start of Pharmacological VTE agent (>24hrs) due to surgical blood loss or risk of bleeding: no

## 2014-04-11 ENCOUNTER — Encounter (HOSPITAL_COMMUNITY): Payer: Self-pay | Admitting: General Surgery

## 2014-04-11 LAB — CBC
HCT: 30.3 % — ABNORMAL LOW (ref 36.0–46.0)
Hemoglobin: 9.2 g/dL — ABNORMAL LOW (ref 12.0–15.0)
MCH: 24.7 pg — ABNORMAL LOW (ref 26.0–34.0)
MCHC: 30.4 g/dL (ref 30.0–36.0)
MCV: 81.5 fL (ref 78.0–100.0)
Platelets: 310 10*3/uL (ref 150–400)
RBC: 3.72 MIL/uL — ABNORMAL LOW (ref 3.87–5.11)
RDW: 16.5 % — AB (ref 11.5–15.5)
WBC: 10 10*3/uL (ref 4.0–10.5)

## 2014-04-11 LAB — BASIC METABOLIC PANEL
Anion gap: 9 (ref 5–15)
BUN: 16 mg/dL (ref 6–23)
CO2: 22 meq/L (ref 19–32)
Calcium: 9.3 mg/dL (ref 8.4–10.5)
Chloride: 111 mEq/L (ref 96–112)
Creatinine, Ser: 1.4 mg/dL — ABNORMAL HIGH (ref 0.50–1.10)
GFR calc Af Amer: 46 mL/min — ABNORMAL LOW (ref >90)
GFR calc non Af Amer: 40 mL/min — ABNORMAL LOW (ref >90)
Glucose, Bld: 123 mg/dL — ABNORMAL HIGH (ref 70–99)
Potassium: 5 mEq/L (ref 3.7–5.3)
SODIUM: 142 meq/L (ref 137–147)

## 2014-04-11 NOTE — Progress Notes (Signed)
1 Day Post-Op  Subjective: No complaints. No nausea. Not having much pain  Objective: Vital signs in last 24 hours: Temp:  [97.6 F (36.4 C)-99.1 F (37.3 C)] 98.8 F (37.1 C) (07/28 0550) Pulse Rate:  [66-98] 85 (07/28 0550) Resp:  [9-18] 17 (07/28 0550) BP: (130-158)/(58-74) 137/64 mmHg (07/28 0550) SpO2:  [97 %-100 %] 98 % (07/28 0550) Weight:  [196 lb (88.905 kg)] 196 lb (88.905 kg) (07/27 1821)    Intake/Output from previous day: 07/27 0701 - 07/28 0700 In: 1833.3 [P.O.:80; I.V.:1753.3] Out: 1625 [Urine:1625] Intake/Output this shift: Total I/O In: 80 [P.O.:80] Out: -   Resp: clear to auscultation bilaterally Cardio: regular rate and rhythm GI: soft, mildly tender. incisions look good. good bs  Lab Results:   Recent Labs  04/11/14 0353  WBC 10.0  HGB 9.2*  HCT 30.3*  PLT 310   BMET  Recent Labs  04/11/14 0353  NA 142  K 5.0  CL 111  CO2 22  GLUCOSE 123*  BUN 16  CREATININE 1.40*  CALCIUM 9.3   PT/INR No results found for this basename: LABPROT, INR,  in the last 72 hours ABG No results found for this basename: PHART, PCO2, PO2, HCO3,  in the last 72 hours  Studies/Results: No results found.  Anti-infectives: Anti-infectives   Start     Dose/Rate Route Frequency Ordered Stop   04/10/14 0600  cefOXitin (MEFOXIN) 2 g in dextrose 5 % 50 mL IVPB     2 g 100 mL/hr over 30 Minutes Intravenous On call to O.R. 04/09/14 1334 04/10/14 0926      Assessment/Plan: s/p Procedure(s): LAPAROSCOPIC ASSISTED  RIGHT COLECTOMY (Right) Continue sips and chips until bowel function returns HTN controlled on home meds D/C foley OOB to chair or ambulate today entereg   LOS: 1 day    [name],PAUL S 04/11/2014

## 2014-04-12 MED ORDER — KETOROLAC TROMETHAMINE 30 MG/ML IJ SOLN
30.0000 mg | INTRAMUSCULAR | Status: DC | PRN
  Administered 2014-04-12: 30 mg via INTRAVENOUS
  Filled 2014-04-12 (×3): qty 1

## 2014-04-12 NOTE — Progress Notes (Signed)
2 Days Post-Op  Subjective: No complaints. She feels she has passed a small amount of flatus  Objective: Vital signs in last 24 hours: Temp:  [98.3 F (36.8 C)-98.8 F (37.1 C)] 98.3 F (36.8 C) (07/29 0453) Pulse Rate:  [77-88] 77 (07/29 0453) Resp:  [16-18] 18 (07/29 0453) BP: (142-163)/(62-76) 150/62 mmHg (07/29 0453) SpO2:  [92 %-100 %] 92 % (07/29 0453)    Intake/Output from previous day: 07/28 0701 - 07/29 0700 In: 1563 [P.O.:200; I.V.:1363] Out: 300 [Urine:300] Intake/Output this shift:    Resp: clear to auscultation bilaterally Cardio: regular rate and rhythm GI: soft, minimal tenderness. good bs. incisions look good  Lab Results:   Recent Labs  04/11/14 0353  WBC 10.0  HGB 9.2*  HCT 30.3*  PLT 310   BMET  Recent Labs  04/11/14 0353  NA 142  K 5.0  CL 111  CO2 22  GLUCOSE 123*  BUN 16  CREATININE 1.40*  CALCIUM 9.3   PT/INR No results found for this basename: LABPROT, INR,  in the last 72 hours ABG No results found for this basename: PHART, PCO2, PO2, HCO3,  in the last 72 hours  Studies/Results: No results found.  Anti-infectives: Anti-infectives   Start     Dose/Rate Route Frequency Ordered Stop   04/10/14 0600  cefOXitin (MEFOXIN) 2 g in dextrose 5 % 50 mL IVPB     2 g 100 mL/hr over 30 Minutes Intravenous On call to O.R. 04/09/14 1334 04/10/14 0926      Assessment/Plan: Colon/p Procedure(Colon): LAPAROSCOPIC ASSISTED  RIGHT COLECTOMY (Right) Advance diet. Start clears today Ambulate Continue home meds for htn entereg  LOS: 2 days    [name],[name] 04/12/2014

## 2014-04-13 MED ORDER — OXYCODONE HCL 5 MG PO TABS: 10.0000 mg | ORAL_TABLET | ORAL | Status: DC | PRN

## 2014-04-13 MED ORDER — IBUPROFEN 600 MG PO TABS
600.0000 mg | ORAL_TABLET | Freq: Four times a day (QID) | ORAL | Status: DC | PRN
Start: 2014-04-13 — End: 2014-04-14
  Administered 2014-04-13: 600 mg via ORAL
  Filled 2014-04-13: qty 1

## 2014-04-13 NOTE — Progress Notes (Signed)
3 Days Post-Op  Subjective: She had a large bm with gas this am. She complains only of a headache  Objective: Vital signs in last 24 hours: Temp:  [97.5 F (36.4 C)-98.5 F (36.9 C)] 98.5 F (36.9 C) (07/30 0524) Pulse Rate:  [71-75] 71 (07/30 0524) Resp:  [16] 16 (07/30 0524) BP: (146-158)/(56-83) 146/83 mmHg (07/30 0524) SpO2:  [96 %-99 %] 96 % (07/30 0524)    Intake/Output from previous day: 07/29 0701 - 07/30 0700 In: 2140.2 [P.O.:1800; I.V.:340.2] Out: 1950 [Urine:1950] Intake/Output this shift: Total I/O In: 404.2 [P.O.:240; I.V.:164.2] Out: 400 [Urine:400]  Resp: clear to auscultation bilaterally Cardio: regular rate and rhythm GI: soft, minimal tenderness. good bs. incisions look good  Lab Results:   Recent Labs  04/11/14 0353  WBC 10.0  HGB 9.2*  HCT 30.3*  PLT 310   BMET  Recent Labs  04/11/14 0353  NA 142  K 5.0  CL 111  CO2 22  GLUCOSE 123*  BUN 16  CREATININE 1.40*  CALCIUM 9.3   PT/INR No results found for this basename: LABPROT, INR,  in the last 72 hours ABG No results found for this basename: PHART, PCO2, PO2, HCO3,  in the last 72 hours  Studies/Results: No results found.  Anti-infectives: Anti-infectives   Start     Dose/Rate Route Frequency Ordered Stop   04/10/14 0600  cefOXitin (MEFOXIN) 2 g in dextrose 5 % 50 mL IVPB     2 g 100 mL/hr over 30 Minutes Intravenous On call to O.R. 04/09/14 1334 04/10/14 0926      Assessment/Plan: s/p Procedure(s): LAPAROSCOPIC ASSISTED  RIGHT COLECTOMY (Right) Advance diet. Start regular food today Entereg Will switch to oral pain meds Ambulate Plan for discharge tomorrow  LOS: 3 days    [name],PAUL S 04/13/2014

## 2014-04-14 NOTE — Progress Notes (Signed)
[name] to be D/C'd Home per MD order.  Discussed with the patient and all questions fully answered.  VSS, Skin clean, dry and intact without evidence of skin break down, no evidence of skin tears noted.  Surgical incisions well approximated with staples and no sign of infection. IV catheter discontinued intact. Site without signs and symptoms of complications. Dressing and pressure applied.  An After Visit Summary was printed and given to the patient. Patient received prescription for pain medication.  D/c education completed with patient/family including follow up instructions, medication list, d/c activities limitations if indicated, with other d/c instructions as indicated by MD - patient able to verbalize understanding, all questions fully answered.   Patient instructed to return to ED, call 911, or call MD for any changes in condition.   Patient escorted via [place], and D/C home via private auto.  [name] 04/14/2014 10:51 AM

## 2014-04-14 NOTE — Discharge Summary (Signed)
Physician Discharge Summary  Patient ID: [name] MRN: [id number] DOB/AGE: 03/28/1951 63 y.o.  Admit date: 04/10/2014 Discharge date: 04/14/2014  Admission Diagnoses:  Discharge Diagnoses:  Active Problems:   Cecal cancer   Discharged Condition: good  Hospital Course: the pt underwent lap assisted right colectomy. She tolerated surgery well. Her postop course was uneventful. She gradually improved and on pod 4 was ready for discharge home  Consults: None  Significant Diagnostic Studies: none  Treatments: surgery: as above  Discharge Exam: Blood pressure 140/62, pulse 74, temperature 97.9 F (36.6 C), temperature source Oral, resp. rate 16, height 5\' 5"  (1.651 m), weight 196 lb (88.905 kg), SpO2 96.00%. Resp: clear to auscultation bilaterally Cardio: regular rate and rhythm GI: soft, nontender  Disposition:   Discharge Instructions   Call MD for:  difficulty breathing, headache or visual disturbances    Complete by:  As directed      Call MD for:  extreme fatigue    Complete by:  As directed      Call MD for:  hives    Complete by:  As directed      Call MD for:  persistant dizziness or light-headedness    Complete by:  As directed      Call MD for:  persistant nausea and vomiting    Complete by:  As directed      Call MD for:  redness, tenderness, or signs of infection (pain, swelling, redness, odor or green/yellow discharge around incision site)    Complete by:  As directed      Call MD for:  severe uncontrolled pain    Complete by:  As directed      Call MD for:  temperature >100.4    Complete by:  As directed      Diet - low sodium heart healthy    Complete by:  As directed      Discharge instructions    Complete by:  As directed   May shower. No heavy lifting. Diet as tolerated     Increase activity slowly    Complete by:  As directed      No wound care    Complete by:  As directed             Medication List         aspirin EC 81 MG tablet   Take 81 mg by mouth daily.     atorvastatin 10 MG tablet  Commonly known as:  LIPITOR  Take 10 mg by mouth at bedtime.     cholecalciferol 1000 UNITS tablet  Commonly known as:  VITAMIN D  Take 1,000 Units by mouth 2 (two) times a week. Takes on Tuesdays and Fridays.     diltiazem 240 MG 24 hr capsule  Commonly known as:  CARDIZEM CD  Take 240 mg by mouth daily before breakfast.     furosemide 40 MG tablet  Commonly known as:  LASIX  Take 40 mg by mouth daily before breakfast.     nebivolol 2.5 MG tablet  Commonly known as:  BYSTOLIC  Take 2.5 mg by mouth at bedtime.           Follow-up Information   Follow up with Merrie Roof, MD In 1 week. (For suture removal)    Specialty:  General Surgery   Contact information:   [address] [place] [address] [phone]       Signed: Merrie Roof 04/14/2014,  8:37 AM

## 2014-04-14 NOTE — Progress Notes (Signed)
4 Days Post-Op  Subjective: No complaints. Tolerating diet. Having bm's  Objective: Vital signs in last 24 hours: Temp:  [97.9 F (36.6 C)-98.9 F (37.2 C)] 97.9 F (36.6 C) (07/31 0604) Pulse Rate:  [71-74] 74 (07/31 0604) Resp:  [16-17] 16 (07/31 0604) BP: (140-147)/(62-70) 140/62 mmHg (07/31 0604) SpO2:  [96 %-99 %] 96 % (07/31 0604) Last BM Date: 04/14/14  Intake/Output from previous day: 07/30 0701 - 07/31 0700 In: 406.5 [P.O.:360; I.V.:46.5] Out: 550 [Urine:550] Intake/Output this shift:    Resp: clear to auscultation bilaterally Cardio: regular rate and rhythm GI: soft, nontender. incisions look good  Lab Results:  No results found for this basename: WBC, HGB, HCT, PLT,  in the last 72 hours BMET No results found for this basename: NA, K, CL, CO2, GLUCOSE, BUN, CREATININE, CALCIUM,  in the last 72 hours PT/INR No results found for this basename: LABPROT, INR,  in the last 72 hours ABG No results found for this basename: PHART, PCO2, PO2, HCO3,  in the last 72 hours  Studies/Results: No results found.  Anti-infectives: Anti-infectives   Start     Dose/Rate Route Frequency Ordered Stop   04/10/14 0600  cefOXitin (MEFOXIN) 2 g in dextrose 5 % 50 mL IVPB     2 g 100 mL/hr over 30 Minutes Intravenous On call to O.R. 04/09/14 1334 04/10/14 0926      Assessment/Plan: s/p Procedure(s): LAPAROSCOPIC ASSISTED  RIGHT COLECTOMY (Right) Discharge  LOS: 4 days    [name],PAUL S 04/14/2014

## 2014-04-17 ENCOUNTER — Ambulatory Visit (INDEPENDENT_AMBULATORY_CARE_PROVIDER_SITE_OTHER): Payer: BC Managed Care – PPO | Admitting: General Surgery

## 2014-04-17 ENCOUNTER — Encounter (INDEPENDENT_AMBULATORY_CARE_PROVIDER_SITE_OTHER): Payer: Self-pay | Admitting: General Surgery

## 2014-04-17 VITALS — BP 142/82 | HR 76 | Resp 16 | Ht 65.5 in | Wt 197.0 lb

## 2014-04-17 DIAGNOSIS — M79661 Pain in right lower leg: Secondary | ICD-10-CM

## 2014-04-17 DIAGNOSIS — C180 Malignant neoplasm of cecum: Secondary | ICD-10-CM

## 2014-04-17 DIAGNOSIS — M79609 Pain in unspecified limb: Secondary | ICD-10-CM

## 2014-04-17 NOTE — Progress Notes (Signed)
Subjective:     Patient ID: [name], female   DOB: [birth year], 63 y.o.   MRN: [id number]  HPI The patient is a 63 year old white female who is one-week status post laparoscopic assisted right colectomy for a cecal cancer. All of her lymph nodes were negative. She is done well and only complains of some mild abdominal soreness. Her appetite is good and her bowels are working normally. Her only complaint is of some mild discomfort in her right calf and foot  Review of Systems     Objective:   Physical Exam On exam her out and the soft with minimal tenderness. Her incisions are all healing nicely with no sign of infection. Her staples were removed and benzoin and Steri-Strips were placed across the incisions. She tolerated this well. There is no obvious swelling of her lower extremities.    Assessment:     The patient is one-week status post laparoscopic assisted right colectomy for colon cancer     Plan:     At this point I will refer her to medical oncology. She will refrain from any heavy lifting. I will also order a lower extremity duplex study Of her right lower extremity to rule out DVT. I will plan to see her back in one month.

## 2014-04-17 NOTE — Patient Instructions (Signed)
Will get u/s right leg Will refer to medical oncology

## 2014-04-18 ENCOUNTER — Telehealth (INDEPENDENT_AMBULATORY_CARE_PROVIDER_SITE_OTHER): Payer: Self-pay | Admitting: *Deleted

## 2014-04-18 NOTE — Telephone Encounter (Signed)
LMOM for pt to return my call regarding her having a Venous Doppler study on her right lower extremity.  Advise pt it has been scheduled for tomorrow, 04-19-14 arriving at [facility] at 10:15 at the [facility].  Anderson Malta

## 2014-04-19 ENCOUNTER — Ambulatory Visit (HOSPITAL_COMMUNITY)
Admission: RE | Admit: 2014-04-19 | Discharge: 2014-04-19 | Disposition: A | Discharge status: Home / Self Care | Payer: BC Managed Care – PPO | Source: Ambulatory Visit | Attending: General Surgery | Admitting: General Surgery

## 2014-04-19 ENCOUNTER — Telehealth (INDEPENDENT_AMBULATORY_CARE_PROVIDER_SITE_OTHER): Payer: Self-pay

## 2014-04-19 DIAGNOSIS — M79609 Pain in unspecified limb: Secondary | ICD-10-CM

## 2014-04-19 DIAGNOSIS — C180 Malignant neoplasm of cecum: Secondary | ICD-10-CM | POA: Insufficient documentation

## 2014-04-19 DIAGNOSIS — M79661 Pain in right lower leg: Secondary | ICD-10-CM

## 2014-04-19 NOTE — Telephone Encounter (Signed)
Vascular Lab called with results of right lower extremity venous duplex completed. Right: No evidence of DVT or Baker's cyst. There appears to be superficial thrombus in a right calf vein. Left: Negative for DVT in the common femoral vein. Called Dr Marlou Starks to see if he wants pt to go home or if there was anything he needed pt to do. Dr Marlou Starks states that pt can be discharged and she needs to take a 81mg  aspirin daily and then see him as scheduled. Informed vascular lab, she states that she will inform the pt.

## 2014-04-19 NOTE — Progress Notes (Signed)
Right lower extremity venous duplex completed.  Right:  No evidence of DVT or Baker's cyst.  There appears to be superficial thrombus in a right calf vein.  Left:  Negative for DVT in the common femoral vein.

## 2014-04-21 ENCOUNTER — Telehealth: Payer: Self-pay | Admitting: *Deleted

## 2014-04-21 NOTE — Telephone Encounter (Signed)
Attempted to reach patient without success.  Left voice message with call back number for appointment.  Contact information provided.

## 2014-04-24 ENCOUNTER — Telehealth: Payer: Self-pay | Admitting: *Deleted

## 2014-04-24 NOTE — Telephone Encounter (Signed)
Spoke with patient and confirmed appointment with Dr. Benay Spice for 05/04/14.  Contact names, numbers, and directions were provided.

## 2014-05-03 ENCOUNTER — Ambulatory Visit: Payer: BC Managed Care – PPO | Admitting: Cardiology

## 2014-05-04 ENCOUNTER — Encounter (INDEPENDENT_AMBULATORY_CARE_PROVIDER_SITE_OTHER): Payer: Self-pay

## 2014-05-04 ENCOUNTER — Telehealth: Payer: Self-pay | Admitting: Oncology

## 2014-05-04 ENCOUNTER — Encounter (HOSPITAL_COMMUNITY): Payer: Self-pay

## 2014-05-04 ENCOUNTER — Ambulatory Visit: Payer: BC Managed Care – PPO

## 2014-05-04 ENCOUNTER — Ambulatory Visit (HOSPITAL_BASED_OUTPATIENT_CLINIC_OR_DEPARTMENT_OTHER): Payer: BC Managed Care – PPO | Admitting: Oncology

## 2014-05-04 ENCOUNTER — Encounter: Payer: Self-pay | Admitting: Oncology

## 2014-05-04 ENCOUNTER — Ambulatory Visit (HOSPITAL_BASED_OUTPATIENT_CLINIC_OR_DEPARTMENT_OTHER): Payer: BC Managed Care – PPO

## 2014-05-04 VITALS — BP 144/72 | HR 69 | Temp 98.0°F | Resp 19 | Ht 65.5 in | Wt 200.0 lb

## 2014-05-04 DIAGNOSIS — I10 Essential (primary) hypertension: Secondary | ICD-10-CM

## 2014-05-04 DIAGNOSIS — C180 Malignant neoplasm of cecum: Secondary | ICD-10-CM

## 2014-05-04 DIAGNOSIS — D649 Anemia, unspecified: Secondary | ICD-10-CM

## 2014-05-04 DIAGNOSIS — C189 Malignant neoplasm of colon, unspecified: Secondary | ICD-10-CM

## 2014-05-04 DIAGNOSIS — Z8544 Personal history of malignant neoplasm of other female genital organs: Secondary | ICD-10-CM

## 2014-05-04 DIAGNOSIS — N189 Chronic kidney disease, unspecified: Secondary | ICD-10-CM

## 2014-05-04 LAB — CBC WITH DIFFERENTIAL/PLATELET
BASO%: 1.1 % (ref 0.0–2.0)
Basophils Absolute: 0.1 10e3/uL (ref 0.0–0.1)
EOS%: 6.5 % (ref 0.0–7.0)
Eosinophils Absolute: 0.4 10e3/uL (ref 0.0–0.5)
HCT: 32.5 % — ABNORMAL LOW (ref 34.8–46.6)
HGB: 10.1 g/dL — ABNORMAL LOW (ref 11.6–15.9)
LYMPH%: 26.5 % (ref 14.0–49.7)
MCH: 24.1 pg — ABNORMAL LOW (ref 25.1–34.0)
MCHC: 31.2 g/dL — ABNORMAL LOW (ref 31.5–36.0)
MCV: 77.2 fL — ABNORMAL LOW (ref 79.5–101.0)
MONO#: 0.5 10e3/uL (ref 0.1–0.9)
MONO%: 6.7 % (ref 0.0–14.0)
NEUT#: 4 10e3/uL (ref 1.5–6.5)
NEUT%: 59.2 % (ref 38.4–76.8)
Platelets: 308 10e3/uL (ref 145–400)
RBC: 4.21 10e6/uL (ref 3.70–5.45)
RDW: 16.6 % — ABNORMAL HIGH (ref 11.2–14.5)
WBC: 6.8 10e3/uL (ref 3.9–10.3)
lymph#: 1.8 10e3/uL (ref 0.9–3.3)

## 2014-05-04 NOTE — Progress Notes (Signed)
Met with [name]. Explained role of nurse navigator. Educational information provided on colon cancer  [place] resources provided to patient, including SW service and support group information. Patient does not need treatment.  She knows to have a colonoscopy on year from last one.  She is going to [facility] testing.  She is going to check with her insurance company to see if further gene mutation testing ,recommended by MD, is covered.  She stated she would contact this RN to give approval for the testing.  MD is aware.   Contact names and phone numbers were provided for entire [facility] team.  Teach back method was used.  No barriers to care identified.

## 2014-05-04 NOTE — Telephone Encounter (Signed)
gv and printed appt sched and avs for pt for Sept and March 2016....sent pt to lab

## 2014-05-04 NOTE — Progress Notes (Signed)
Checked in new pt with no financial concerns. °

## 2014-05-04 NOTE — Progress Notes (Signed)
[place] New Patient Consult   Referring MD: Merrie Roof, Md [place], [place] [address]   [name] 63 y.o.  1951/03/13    Reason for Referral: Colon cancer   HPI: She reports an approximate 3 week history of fatigue and nausea prior to seeing her primary physician. She was noted to be anemic and reports the stool was Hemoccult positive. She was referred to Dr. Amedeo Plenty and was taken to a colonoscopy on 02/22/2014. A nonobstructing mass was found in the cecum. The mass was biopsied. A sessile polyp was found in the sigmoid colon. The polyp was removed. The pathology revealed adenocarcinoma involving the cecum biopsy.  a CT of the abdomen and pelvis on 02/28/2014 pound the liver and adrenal glands appear unremarkable. An intraluminal soft tissue lesion was noted in the cecum. No adjacent enlarged lymph nodes. No evidence of metastatic disease.  She was referred to Dr. Marlou Starks and was taken to the operating room on 04/10/2014 and underwent a laparoscopic assisted right colectomy. The liver appeared normal.  The pathology ([id number]) revealed an invasive adenocarcinoma of the cecum. Tumor extended focally into pericolonic soft tissue. Lymphovascular and perineural invasion were absent. The proximal, distal, and circumferential margins were negative. There was an additional tubular adenoma and a sessile serrated polyp. 14 lymph nodes were negative for metastatic disease. No macroscopic tumor perforation. The tumor was moderately differentiated. The tumor returned microsatellite instability-High with loss of MLH1 and PMS2 expression.  The nausea she experienced prior to surgery has resolved. She reports undergoing a negative colonoscopy in 2012.  Past Medical History  Diagnosis Date  . History of endometrial cancer, 1 cm, FIGO grade 3,pT1a,N0  June 2006   . HLD (hyperlipidemia)   . Hypothyroidism     subclinical  . CKD (chronic kidney  disease), stage III     GFR 30-59  . H/O Guillain-Barre syndrome     mid-late 1980's  . Post-streptococcal glomerulonephritis   . Heart murmur   . Asthma in adult     seasonal mild  (allergies) , intermittent   . Anemia-microcytic   2015       .  G0 P0        . NLZJQBHA(193.7)     last difficult headache - early June - 2015  . GERD (gastroesophageal reflux disease)     sick in May 2015, multiple complaints, but one being "pushing up sensation in her chest, abdomen region", followed by cardiac at first for clearance for colonscopy- started on Prilosec temporarily, but hasn't taken in 2 weeks    . Hypertension     recent cardiac consult - 01/2014 done for compalints relative to nausea & breathing changes, clearance for colonoscopy     Past Surgical History  Procedure Laterality Date  . Total abdominal hysterectomy w/ bilateral salpingoophorectomy    . Exploratory laparotomy    . Lymphadenectomy      paraaortic and pelvic  . Colonoscopy w/ polypectomy    . Renal biopsy    . Tonsillectomy and adenoidectomy    . Knee arthroscopy Right   . Nm myoview ltd  02/03/2014    LOW RISK: Exercise 6:51 / 7 METS; + EKG for Ischemia, No CP; Images negative for ischemia or infarction  . Transthoracic echocardiogram  02/03/2014    EF 55-60%. No regional WMA, Gr 3 DD (High filling pressures); Mod LA dilation  . 48 hr holter monitor  01/2014  NSR with intermittent PVCs; no arrhythmia  . Abdominal hysterectomy    . Right colectomy  04/10/2014    DR TOTH  . Laparoscopic right colectomy Right 04/10/2014    Procedure: LAPAROSCOPIC ASSISTED  RIGHT COLECTOMY;  Surgeon: Merrie Roof, MD;  Location: [place];  Service: General;  Laterality: Right;    Medications: Reviewed  Allergies:  Allergies  Allergen Reactions  . Sulfa Antibiotics Rash    Family history: Her maternal grandfather had colon cancer. Her maternal grandmother had pancreatic cancer. Her father had colon cancer in his 58s. Her sister  had colon cancer at age 11. A paternal aunt had colon cancer. Paternal cousins "have a history of colon cancer. 2 paternal aunts had breast cancer.  Social History:   She lives alone in [place]. 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 She does not use tobacco. No heavy alcohol use. No transfusion history. No risk factor for HIV or hepatitis.  History  Alcohol Use  . Yes    Comment: social- monthly     History  Smoking status  . Never Smoker   Smokeless tobacco  . Never Used      ROS:   Positives include: Nausea for 3 weeks prior to being diagnosed with colon cancer, fatigue prior to being diagnosed with colon cancer  A complete ROS was otherwise negative.  Physical Exam:  Blood pressure 144/72, pulse 69, temperature 98 F (36.7 C), temperature source Oral, resp. rate 19, height 5' 5.5" (1.664 m), weight 200 lb (90.719 kg).  HEENT: Oropharynx without visible mass, neck without mass Lungs: Clear bilaterally Cardiac: Regular rate and rhythm Abdomen: No hepatosplenomegaly, healed surgical incisions, nontender, no mass  Vascular: No leg edema Lymph nodes: No cervical, supraclavicular, axillary, or inguinal nodes Neurologic: Alert and oriented, the motor exam appears grossly intact in the upper and lower extremities Skin: No rash Musculoskeletal: No spine tenderness   LAB:  CBC  Lab Results  Component Value Date   WBC 6.8 05/04/2014   HGB 10.1* 05/04/2014   HCT 32.5* 05/04/2014   MCV 77.2* 05/04/2014   PLT 308 05/04/2014   NEUTROABS 4.0 05/04/2014     CMP      Component Value Date/Time   NA 142 04/11/2014 0353   K 5.0 04/11/2014 0353   CL 111 04/11/2014 0353   CO2 22 04/11/2014 0353   GLUCOSE 123* 04/11/2014 0353   BUN 16 04/11/2014 0353   CREATININE 1.40* 04/11/2014 0353   CALCIUM 9.3 04/11/2014 0353   GFRNONAA 40* 04/11/2014 0353   GFRAA 46* 04/11/2014 0353    CEA pending today Imaging:  As per history of present illness, chest x-ray 04/03/2014-the lungs are  clear   Assessment/Plan:   1. 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 History of uterine cancer-2006  3.   history of glomerulonephritis with chronic renal insufficiency  4.   family history of multiple cancers including colon cancer  5.   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   Hypertension  7.   anemia-microcytic, likely secondary to colon cancer   Disposition:   [name] has been diagnosed with stage II adenocarcinoma of the cecum. I discussed the diagnosis, prognosis, and adjuvant treatment options with her today. I explained the benefit associated with 5-fluorouracil-based adjuvant chemotherapy in patients with resected stage III colon cancer. We discussed the lack of evidence to support a clear benefit  with adjuvant therapy in patients with stage II disease. Her tumor does not have "high-risk" features. She has a good prognosis for a long-term disease-free survival. I do not recommend adjuvant chemotherapy.  She has a personal and family history suggestive of hereditary non-polyposis colon cancer syndrome. We will make a referral to the genetics screening clinic and we will proceed with additional diagnostic testing for HNPCC.  The loss of MLH1 and PMS2 expression is usually associated with MLH1 hypermethylation. However in her case I suspect the findings may be related to a germline mutation.  I explained patients with hereditary non-polyposis colon cancer syndrome do not benefit from adjuvant chemotherapy in this setting.  The anemia should improve over the next few months. She does not wish to begin iron therapy.  [name] will return for an office visit and CEA in 6 months.  She will continue surveillance colonoscopies with Dr. Amedeo Plenty.  Approximately 50 minutes were spent with the patient today. The majority of the time was used  for counseling and coordination of care.  [place], [place] 05/04/2014, 6:44 PM

## 2014-05-04 NOTE — Telephone Encounter (Signed)
gv and printed appt sched and avs for pt for Sept adn March 2016

## 2014-05-05 LAB — CEA: CEA: 2.1 ng/mL (ref 0.0–5.0)

## 2014-05-10 ENCOUNTER — Telehealth: Payer: Self-pay | Admitting: *Deleted

## 2014-05-10 NOTE — Telephone Encounter (Signed)
Message copied by Brien Few on Wed May 10, 2014 11:26 AM ------      Message from: [name]      Created: Fri May 05, 2014  5:29 PM       Please call patient, cea is normal ------

## 2014-05-10 NOTE — Telephone Encounter (Signed)
Spoke with patient by phone.  Informed her the Team lead for [facility] managed care, Darlena,  called her insurance company and gave CPT codes for tests Dr. Benay Spice has ordered on her pathology.  Darlena was told no authorization required.  Patient informed and she is going to contact her insurance company [employer] with ref# [id number]  to verify coverage of the MLH1 and PMS2 testing that Dr. Benay Spice has ordered on her pathology.  Patient stated she would contact this RN with her decision to proceed with testing once she has contacted her insurance company and confirmed coverage.  She was given direct dial for this RN.

## 2014-05-10 NOTE — Telephone Encounter (Signed)
Called pt with CEA results. Normal, per Dr. Sherrill. She voiced understanding. 

## 2014-05-16 ENCOUNTER — Telehealth: Payer: Self-pay | Admitting: *Deleted

## 2014-05-16 ENCOUNTER — Encounter: Payer: Self-pay | Admitting: Gastroenterology

## 2014-05-16 NOTE — Telephone Encounter (Signed)
Per patient request, she rather wait and go to the genetics counselor first (scheduled for September 17th at [facility]) before making a decision on the [facility] and PMS2 testing on her pathology.  This RN requested for patient to call Dr. Benay Spice or his nurse once she has made a decision.  Patient agreed to this plan.  Dr. Benay Spice was notified.

## 2014-05-18 ENCOUNTER — Encounter (INDEPENDENT_AMBULATORY_CARE_PROVIDER_SITE_OTHER): Payer: BC Managed Care – PPO | Admitting: General Surgery

## 2014-05-25 ENCOUNTER — Telehealth: Payer: Self-pay | Admitting: *Deleted

## 2014-05-25 NOTE — Telephone Encounter (Signed)
Received a call from [place] to contact the pt about genetic insurance coverage.  Unable to answer the questions, so I emailed Ofri and Cat (the genetic counselors) to call the pt.

## 2014-06-01 ENCOUNTER — Ambulatory Visit (HOSPITAL_BASED_OUTPATIENT_CLINIC_OR_DEPARTMENT_OTHER): Payer: BC Managed Care – PPO | Admitting: Genetic Counselor

## 2014-06-01 ENCOUNTER — Other Ambulatory Visit: Payer: BC Managed Care – PPO

## 2014-06-01 DIAGNOSIS — Z800 Family history of malignant neoplasm of digestive organs: Secondary | ICD-10-CM

## 2014-06-01 DIAGNOSIS — C541 Malignant neoplasm of endometrium: Secondary | ICD-10-CM

## 2014-06-01 DIAGNOSIS — Z803 Family history of malignant neoplasm of breast: Secondary | ICD-10-CM | POA: Insufficient documentation

## 2014-06-01 DIAGNOSIS — IMO0002 Reserved for concepts with insufficient information to code with codable children: Secondary | ICD-10-CM

## 2014-06-01 DIAGNOSIS — C180 Malignant neoplasm of cecum: Secondary | ICD-10-CM

## 2014-06-01 DIAGNOSIS — C549 Malignant neoplasm of corpus uteri, unspecified: Secondary | ICD-10-CM

## 2014-06-01 NOTE — Progress Notes (Signed)
HISTORY OF PRESENT ILLNESS: Dr. Jonni Sanger requested a cancer genetics consultation for [name], a 63 y.o. female, due to a personal and family history of cancer.  [name] presents to clinic today to discuss the possibility of a hereditary predisposition to cancer, genetic testing, and to further clarify her future cancer risks, as well as potential cancer risk for family members.   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 She is s/p right colectomy. Her tumor was MSI-High and showed loss of protein expression for MLH1 and PMS2. She required no further treatment. She also has a history of endometrial cancer at age 60 and is s/p TAH-BSO. She reports she is due for a mammogram.  Past Medical History  Diagnosis Date   History of endometrial cancer diagnosed at age 16; s/p TAH-BSO    HLD (hyperlipidemia)    Hypothyroidism     subclinical   CKD (chronic kidney disease), stage III     GFR 30-59   H/O Guillain-Barre syndrome     mid-late 1980's   Post-streptococcal glomerulonephritis    Heart murmur    Asthma in adult     seasonal mild  (allergies) , intermittent    Anemia     Unclear etiology   Cancer     endometrial- tx/w hysterectomy   Headache(784.0)     last difficult headache - early June - 2015   GERD (gastroesophageal reflux disease)     sick in May 2015, multiple complaints, but one being "pushing up sensation in her chest, abdomen region", followed by cardiac at first for clearance for colonscopy- started on Prilosec temporarily, but hasn't taken in 2 weeks     Hypertension     recent cardiac consult - 01/2014 done for compalints relative to nausea & breathing changes, clearance for colonoscopy     Past Surgical History  Procedure Laterality Date   Total abdominal hysterectomy w/ bilateral salpingoophorectomy     Exploratory laparotomy     Lymphadenectomy      paraaortic and pelvic   Colonoscopy w/ polypectomy     Renal  biopsy     Tonsillectomy and adenoidectomy     Knee arthroscopy Right    Nm myoview ltd  02/03/2014    LOW RISK: Exercise 6:51 / 7 METS; + EKG for Ischemia, No CP; Images negative for ischemia or infarction   Transthoracic echocardiogram  02/03/2014    EF 55-60%. No regional WMA, Gr 3 DD (High filling pressures); Mod LA dilation   48 hr holter monitor  01/2014    NSR with intermittent PVCs; no arrhythmia   Abdominal hysterectomy     Right colectomy  04/10/2014    DR TOTH   Laparoscopic right colectomy Right 04/10/2014    Procedure: LAPAROSCOPIC ASSISTED  RIGHT COLECTOMY;  Surgeon: Merrie Roof, MD;  Location: [facility];  Service: General;  Laterality: Right;     History   Social History   Marital Status: Single    Spouse Name: N/A    Number of Children: N/A   Years of Education: N/A   Social History Main Topics   Smoking status: Never Smoker    Smokeless tobacco: Never Used   Alcohol Use: Yes     Comment: social- monthly    Drug Use: No   Sexual Activity: Not on file   Other Topics Concern   Not on file   Social History Narrative   Single. Self-employed; [employer].  Never smoked. Social EtOH.   Works out at a gym 1-2 days a week for one half hours. She hasn't been able to do her exercise as much as anticipated over the past year because she has been caring for her parents.  Her father died many of last year, and her mother died in 10/08/22.  She's been under significant stress in the past year.     FAMILY HISTORY:  During the visit, a 4-generation pedigree was obtained. Significant diagnoses include the following:  Family History  Problem Relation Age of Onset   Colon cancer Father 39   Valvular heart disease Father    Colon cancer Sister 6   Pancreatic cancer Maternal Grandmother 15   Colon cancer Maternal Grandfather 17   Breast cancer Paternal Aunt 38   Colon cancer Cousin 7    pat female first cousin    [name]'s  ancestry is of Caucasian descent. There is no known Jewish ancestry or consanguinity.  GENETIC COUNSELING ASSESSMENT: [name] is a 63 y.o. female with a personal and family history of cancer consistent clinically with Lynch syndrome. In addition, her tumor studies for Lynch syndrome were also indicative of this condition. We, therefore, discussed and recommended the following at today's visit.   DISCUSSION / PLAN: We reviewed the characteristics, features and inheritance patterns of Lynch syndrome. We also discussed genetic testing, including the appropriate family members to test, the process of testing, insurance coverage and turn-around-time for results. We discussed the implications of a negative, positive and/or variant of uncertain significant result. Due to the loss of protein expression of MLH1 and PMS2 in the tumor, we recommended [name] pursue genetic testing for the MLH1 gene. If this testing is negative for a germline mutation, we will ask the lab to perform testing for the PMS2 gene, although a mutation in this gene is unlikely. We also discussed with [name] that independent of genetic test results, her personal and family history indicate she has Lynch syndrome and thus should be following Lynch syndrome screening guidelines.   In addition, we discussed that although there is a family history of breast cancer (2 paternal aunts in their 29s), she is at low risk to harbour a hereditary breast cancer gene mutation and do not recommend testing for hereditary breast cancer genes at this time. However, should her family history change, we have asked her to let us know this information, as this can sometimes change our assessment and recommendations.  Based on our above recommendation, [name] wished to pursue genetic testing and the blood sample was drawn and will be sent to [facility] for analysis. Results should be available within approximately 3 weeks time, at  which point they will be disclosed by telephone to [name], as will any additional recommendations warranted by these results.   We also encouraged [name] to remain in contact with cancer genetics annually so that we can continuously update the family history and inform her of any changes in cancer genetics and testing that may be of benefit for this family. Ms.  Colon questions were answered to her satisfaction today. Our contact information was provided should additional questions or concerns arise.   Thank you for the referral and allowing [place] to share in the care of your patient.   The patient was seen for a total of 45 minutes in face-to-face genetic counseling.  This patient was discussed with Benay Spice who agrees with the above.    _______________________________________________________________________  For Office Staff:  Number of people involved in session: 2 Was an Intern/ student involved with case: not applicable

## 2014-06-09 ENCOUNTER — Other Ambulatory Visit (HOSPITAL_COMMUNITY)
Admission: RE | Admit: 2014-06-09 | Discharge: 2014-06-09 | Disposition: A | Discharge status: Home / Self Care | Payer: BC Managed Care – PPO | Source: Ambulatory Visit | Attending: Obstetrics and Gynecology | Admitting: Obstetrics and Gynecology

## 2014-06-09 ENCOUNTER — Other Ambulatory Visit: Payer: Self-pay | Admitting: Obstetrics and Gynecology

## 2014-06-09 DIAGNOSIS — Z01419 Encounter for gynecological examination (general) (routine) without abnormal findings: Secondary | ICD-10-CM | POA: Insufficient documentation

## 2014-06-12 LAB — CYTOLOGY - PAP

## 2014-06-14 ENCOUNTER — Telehealth: Payer: Self-pay | Admitting: *Deleted

## 2014-06-14 ENCOUNTER — Telehealth (INDEPENDENT_AMBULATORY_CARE_PROVIDER_SITE_OTHER): Payer: Self-pay | Admitting: General Surgery

## 2014-06-14 NOTE — Telephone Encounter (Signed)
Pt called in c/o of searing burning pain on left side of abd for past 2 days. Some nausea and chills. S/p lap rt colectomy by Marlou Starks in July. States pain is near one of the trocar sites. Pt just wants to make sure nothing is going on. Told pt our office would call her later this am and see if Dr Marlou Starks is in office or urgent office appt today.

## 2014-06-14 NOTE — Telephone Encounter (Signed)
Message from pt reporting severe, burning L abdominal pain. Returned call, she reports she contacted Dr. Ethlyn Gallery office and will be seen there 10/1. She will have surgeon call Dr. Benay Spice if needed.

## 2014-06-16 ENCOUNTER — Telehealth: Payer: Self-pay | Admitting: *Deleted

## 2014-06-16 NOTE — Telephone Encounter (Signed)
Calling to inquire about results of her genetics testing done in September? Will she be seeing Dr. Benay Spice to discuss results. Saw Dr. Barry Dienes on 06/15/14 regarding her sharp pain in upper abdomen near site of laparotomy/incision. Did not think it was infection. Found nothing on exam. Was told to call if it persists after a week. She is asking for Dr. Gearldine Shown thoughts on this. Is it normal to have this kind of pain so long after the procedure? Reports she can't button her slacks because of swelling in the area and discomfort. Informed her that surgeon would be the best to determine if that is a possible discomfort post op, but will make Dr. Benay Spice aware of her concern.

## 2014-06-20 ENCOUNTER — Encounter: Payer: Self-pay | Admitting: Genetic Counselor

## 2014-06-20 DIAGNOSIS — C180 Malignant neoplasm of cecum: Secondary | ICD-10-CM

## 2014-06-20 DIAGNOSIS — C541 Malignant neoplasm of endometrium: Secondary | ICD-10-CM

## 2014-06-20 DIAGNOSIS — Z803 Family history of malignant neoplasm of breast: Secondary | ICD-10-CM

## 2014-06-20 DIAGNOSIS — Z800 Family history of malignant neoplasm of digestive organs: Secondary | ICD-10-CM

## 2014-06-20 NOTE — Progress Notes (Signed)
HPI:  [name] was previously seen in the [place] clinic due to a personal and family history of cancer and concerns regarding a hereditary predisposition to cancer. Please refer to our prior cancer genetics clinic note for more information regarding [name]'s medical, social and family histories, and our assessment and recommendations, at the time. [name] recent genetic test results were disclosed to her, as were recommendations warranted by these results. These results and recommendations are discussed in more detail below.  GENETIC TEST RESULTS:  [name] has a personal history of endometrial cancer at age 63, colorectal (cecal) cancer at age 71, and her colorectal tumor showed loss of the MLH1/PMS2 proteins and was MSI-High. In addition, she has a significant family history of colorectal cancer, which in combination with her history, is consistent clinically with Lynch syndrome.   Therefore, at the time of [name]'s visit, we recommended she pursue genetic testing for Lynch syndrome, specifically looking at the [facility] gene due to her colorectal tumor results. This test included sequencing and deletion/duplication analysis of the MLH1 gene. Genetic testing was normal and did not reveal a pathogenic mutation in this gene. A copy of the genetic test report will be scanned into Epic under the media tab.   ADDITIONAL GENETIC TESTING: We discussed with [name] that there are additional genetic tests that we can pursue for her, such as additional tumor testing on her colorectal tumor (MLH1 hypermethylation), and/or gene testing for the PMS2 gene (given the secondary loss of PMS2 protein expression in the tumor); however, after discussing [name]'s case with colleagues, we feel that the best next step would be for her sister, who was diagnosed with colorectal cancer at age 38, to have genetic counseling and genetic testing, and we are happy to help coordinate this for  her sister, or refer her sister locally.   CANCER SCREENING RECOMMENDATIONS: We discussed with [name] that given her personal and family histories, we must interpret these negative results with caution.  Families with features suggestive of hereditary risk for cancer tend to have multiple family members with cancer, diagnoses in multiple generations and diagnoses before the age of 58. [name] family exhibits these features. Thus, as mentioned above, this result may simply reflect our current inability to detect all mutations within this gene or there may be a different gene that has not yet been discovered responsible for the cancer in the family.  We discussed with [name] that her personal and family histories of cancer are consistent clinically with Lynch syndrome and thus, she and her family members should follow the NCCN screening guidelines for Lynch syndrome outlined below, until we can determine a molecular cause for Lynch syndrome in the family and determine with certainty who did and did not inherit it. These guidelines can be coordinated by [name].   [facility] SYNDROME SCREENING GUIDELINES:  1.  Annual colonoscopy beginning at age 29-25 or 2-5 years prior to the earliest colon cancer diagnosis.   2. While there is no clear evidence to support screening for stomach and small bowel cancer, an upper endoscopy can be considered at 3-5 year intervals beginning at age 85-35. However, whether to have this screening is best determined by the gastroenterologist.   3.  Consider an annual urinalysis beginning at age 9-30.  4. There is some evidence to suggest that aspirin may reduce the risk for colorectal cancer in patients with Lynch syndrome, but there  is currently no standard recommendation for dosage.   For women with Lynch syndrome, unlike the effective surveillance plan for colorectal cancer risk, there is no professional agreement regarding  management for the increased risk of uterine and ovarian cancer. However, we are available to help women and their providers establish an individualized surveillance plan. It is also important for women to understand the following:   1. Women should seek medical attention if they experience abnormal vaginal bleeding.   2. Some providers may still recommend vaginal ultrasounds, uterine biopsies (for uterine cancer risk) and/or CA-125 analysis ( for ovarian cancer risk), even though these have not been shown to be effective.  3. A hysterectomy with removal of the ovaries and fallopian tubes should be considered once childbearing is completed (if planned).  RECOMMENDATIONS FOR FAMILY MEMBERS:  As mentioned above, we recommended further genetic testing in Ms. Haug's family as such testing might help [place] be even more confident in interpreting Ms. Dannenberg's own results. We recommended Ms. Maddy's sister, who was diagnosed with colorectal cancer at age 64, have testing. Please let us know if we can help facilitate testing. Genetic counselors can be located in other cities, by visiting the website of the [employer] (ArtistMovie.se) and Field seismologist for a Dietitian by zip code.  Until we know the exact genetic cause responsible for the family history of cancer, we cannot offer genetic testing to other family members. Therefore, in the meantime, we recommend that Ms. Retana's immediate family members and paternal relatives also follow the above Lynch syndrome screening recommendations.    FOLLOW-UP: Lastly, we discussed with Ms. Wadleigh that cancer genetics is a rapidly advancing field and it is likely that new genetic tests will be appropriate for her and/or family members in the future. We encouraged her to remain in contact with cancer genetics on an annual basis so we can update her personal and family histories and let her know of advances in cancer genetics that may  benefit this family.   Our contact number was provided. Ms. Pair questions were answered to her satisfaction, and she knows she is welcome to call us at anytime with additional questions or concerns.    Catherine A. Fine, MS, CGC Certified Genetic [email or web address] Phone: [phone]

## 2014-06-22 ENCOUNTER — Telehealth: Payer: Self-pay | Admitting: *Deleted

## 2014-06-22 NOTE — Telephone Encounter (Signed)
Spoke with patient. Results from genetic testing not back yet. Per dr Benay Spice, she may call back in 1-2 weeks. F/u with surgeon, if pain persists. Patient verbalizes understanding.

## 2014-07-06 ENCOUNTER — Ambulatory Visit (HOSPITAL_COMMUNITY): Payer: BC Managed Care – PPO

## 2014-07-21 ENCOUNTER — Ambulatory Visit (HOSPITAL_COMMUNITY)
Admission: RE | Admit: 2014-07-21 | Discharge: 2014-07-21 | Disposition: A | Discharge status: Home / Self Care | Payer: BC Managed Care – PPO | Source: Ambulatory Visit | Attending: Family Medicine | Admitting: Family Medicine

## 2014-07-21 DIAGNOSIS — Z1231 Encounter for screening mammogram for malignant neoplasm of breast: Secondary | ICD-10-CM | POA: Insufficient documentation

## 2014-09-26 IMAGING — CR DG CHEST 2V
2 series · 2 of 2 positions shown · non-contrast
Comparison: 01/31/2005

CLINICAL DATA: Preop colectomy

EXAM:
CHEST  2 VIEW

[w chest pa]
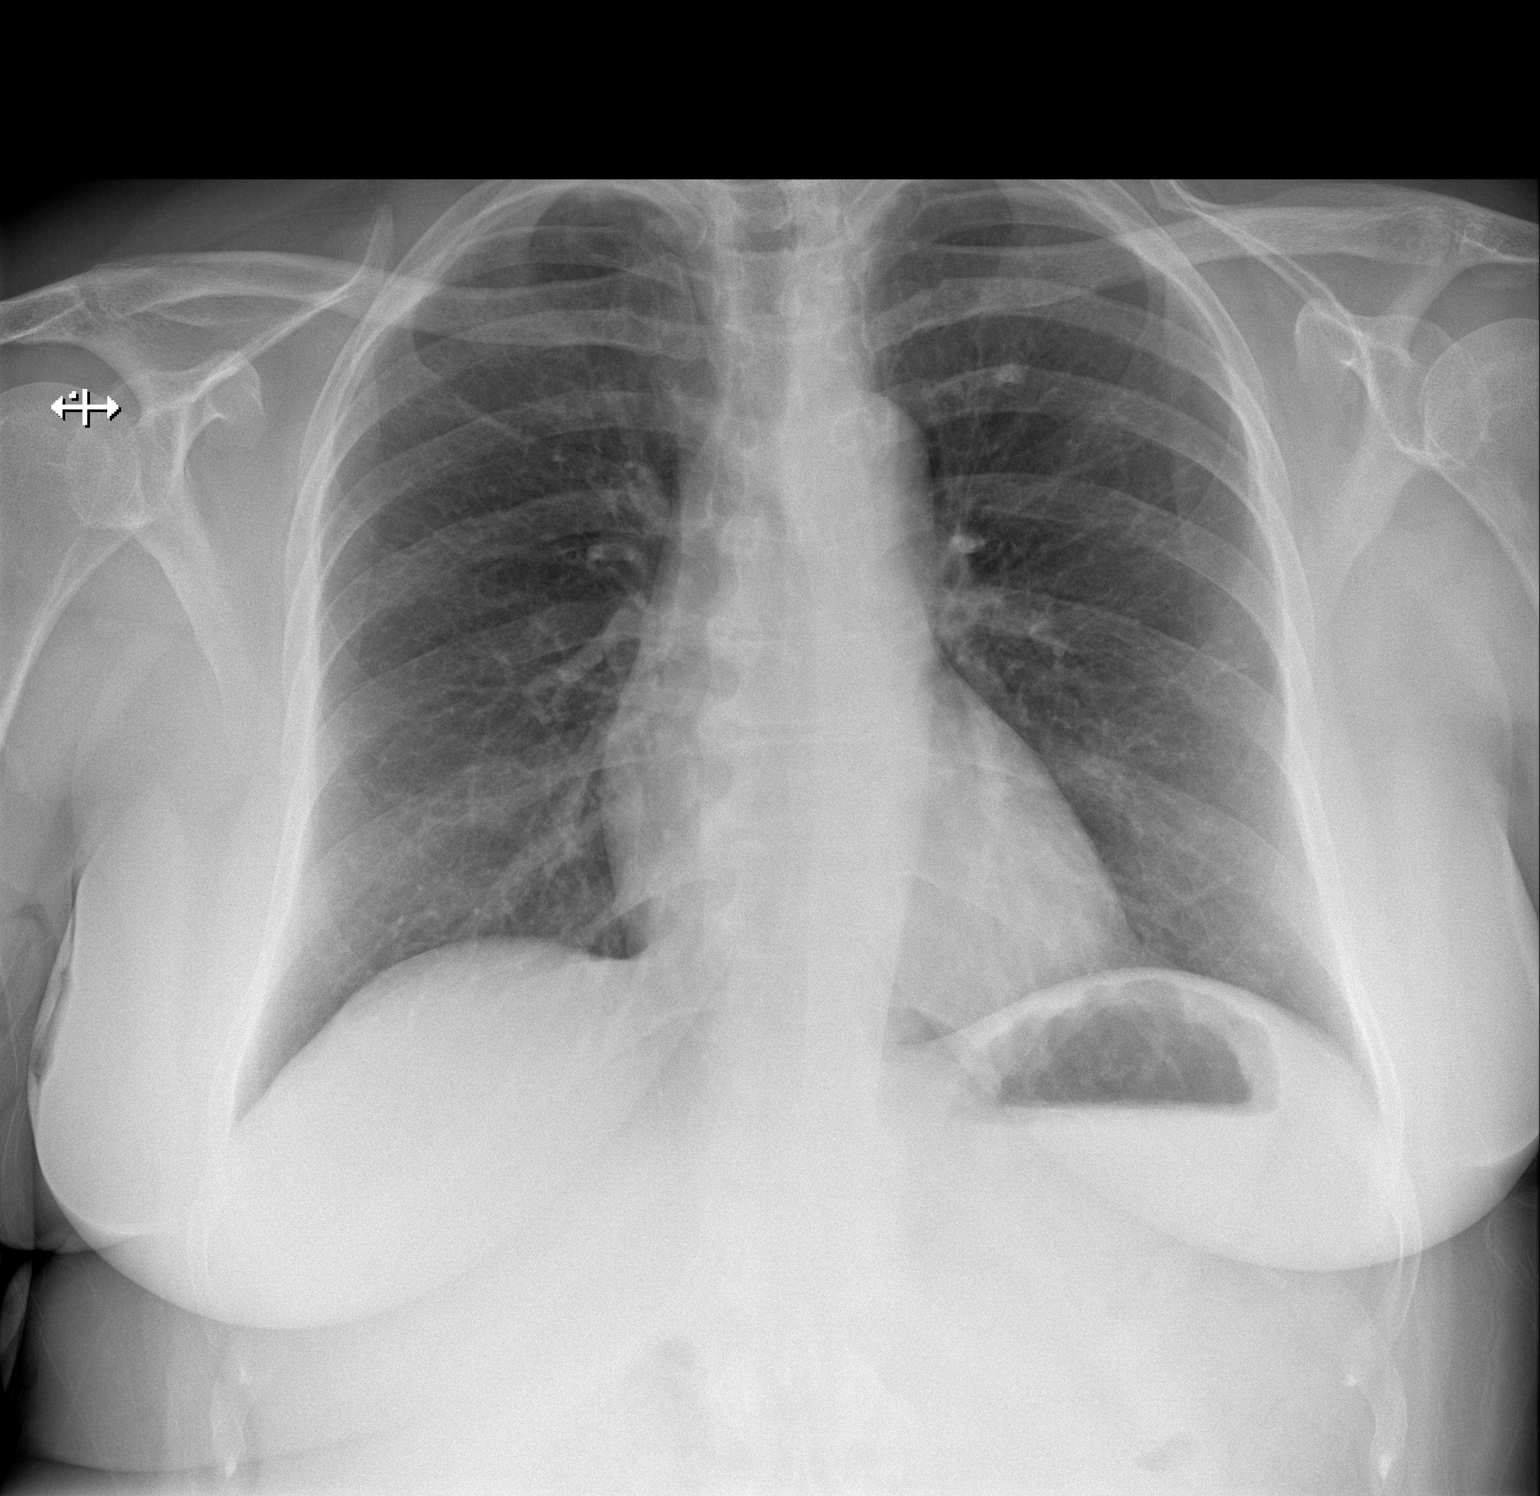

[w chest lat]
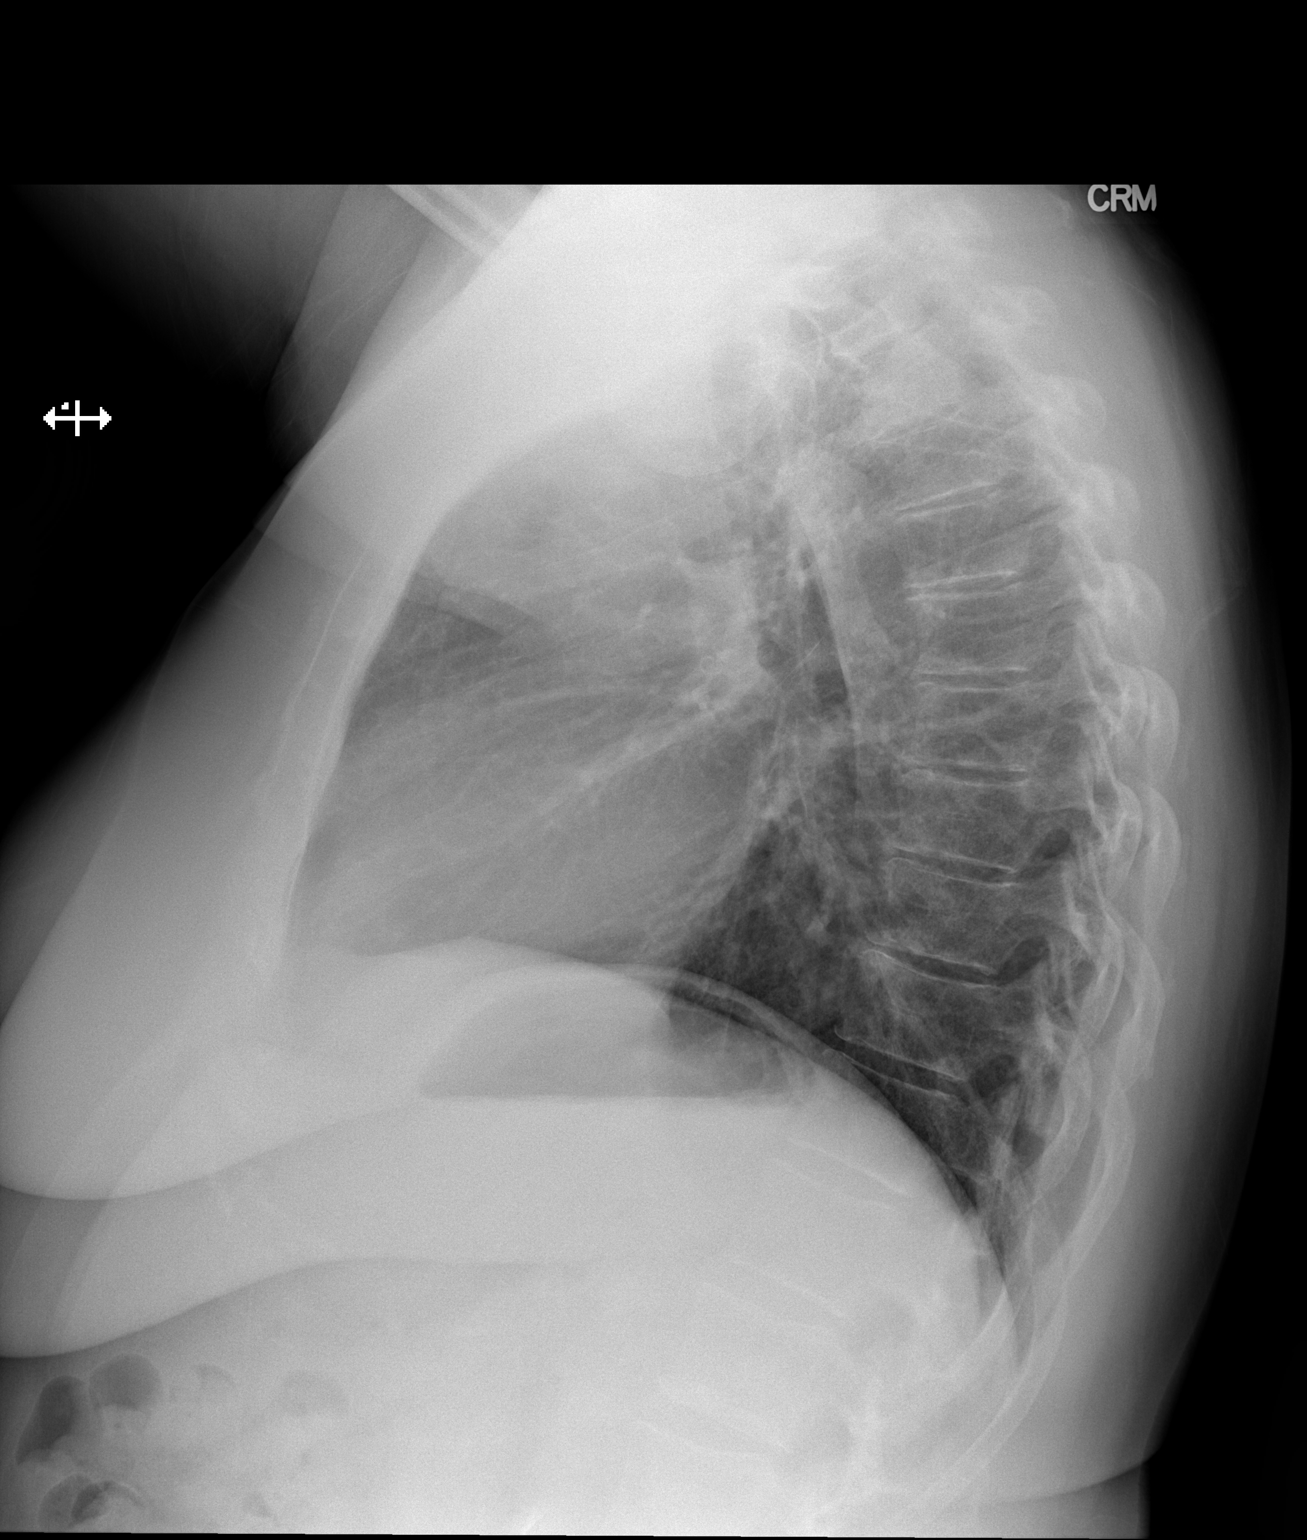

[2 of 2 positions shown; findings below may reference images not displayed]

FINDINGS: The heart size and mediastinal contours are within normal limits.
Both lungs are clear. The visualized skeletal structures are
unremarkable.
IMPRESSION: No active cardiopulmonary disease.

## 2014-09-27 ENCOUNTER — Telehealth: Payer: Self-pay | Admitting: Cardiology

## 2014-09-27 MED ORDER — NEBIVOLOL HCL 5 MG PO TABS: 2.5000 mg | ORAL_TABLET | Freq: Every day | ORAL | Status: DC

## 2014-09-27 NOTE — Telephone Encounter (Signed)
5mg  samples left at front desk, pt called and instructions given to take 1/2 tablet of this strength in place of her 2.5mg  tab. Pt voiced understanding.

## 2014-09-27 NOTE — Telephone Encounter (Signed)
Pt would like some samples of Bystolic 2.5 mg please.If not 2.5 mg, one of the other milligram.

## 2014-10-16 ENCOUNTER — Telehealth: Payer: Self-pay | Admitting: Cardiology

## 2014-10-16 MED ORDER — NEBIVOLOL HCL 2.5 MG PO TABS: 2.5000 mg | ORAL_TABLET | Freq: Every day | ORAL | Status: DC

## 2014-10-16 NOTE — Telephone Encounter (Signed)
Rx refill sent to patient pharmacy   Samples of this drug were given to the patient, quantity 2(14 tablets), Lot Number [id number]

## 2014-10-16 NOTE — Telephone Encounter (Signed)
°  1. Which medications need to be refilled? Bystolic 2.5 mg  2. Which pharmacy is medication to be sent to? [facility] on [address]  3. Do they need a 30 day or 90 day supply? 30, She would like 90 day supply  4. Would they like a call back once the medication has been sent to the pharmacy? Yes  *She would like some samples of Bystolic if possible*

## 2014-10-17 ENCOUNTER — Telehealth: Payer: Self-pay | Admitting: Cardiology

## 2014-10-17 NOTE — Telephone Encounter (Signed)
Spoke with pt, prior auth through covermy meds started for the patient. Aware can take 3 business days for response.

## 2014-10-17 NOTE — Telephone Encounter (Signed)
Pt need prior authorization for her Bystolic. Please call BC[employer]-to do this for her please.

## 2014-10-27 ENCOUNTER — Telehealth: Payer: Self-pay | Admitting: *Deleted

## 2014-10-27 MED ORDER — ATENOLOL 25 MG PO TABS: 25.0000 mg | ORAL_TABLET | Freq: Every day | ORAL | Status: DC

## 2014-10-27 NOTE — Telephone Encounter (Signed)
Spoke to patient RN informed patient insurance.  [employer]  [place] denied  BYSTOLIC  MEDICATION MAY BE COVERED WHEN 2 ALTRENATIVE MEDICATIONS HAVE BEEN TRIED. (ATENOLOL,CARVEDILOL, METOPROLOL)   PER DR HARDING -- DISCONTINUE BYSTOLIC  START ATENOLOL 25 MG  DAILY-- MONITOR BLOOD PRESSURE, CONTACT OFFICE WITH ANY SYMPTOMS. PATIENT VERBALIZED UNDERSTANDING. E-SENT 90 SUPPLY

## 2014-11-08 ENCOUNTER — Telehealth: Payer: Self-pay | Admitting: Cardiology

## 2014-11-08 NOTE — Telephone Encounter (Signed)
Pt recently switched from Bystolic due to [place] denial of coverage.  She has been on Atenolol ~10-days now and has noted increased fatigue.  She wants to know if this will get better or if she would need to be switched to another medication.  She requests consideration be given regarding her kidney issues in electing medication alternatives.   Pt reports BPs & HR WNL, no other symptoms.

## 2014-11-08 NOTE — Telephone Encounter (Signed)
Pt have been trying Atenolol,it is making her tired. She was trying this in the place of the Bystolic.

## 2014-11-09 NOTE — Telephone Encounter (Signed)
I would give it about a month - if still symptomatic. We can change to Carvedilol.  [place]

## 2014-11-09 NOTE — Telephone Encounter (Signed)
Advised pt on Dr. Allison Quarry instructions. She expressed understanding. Will call back in a couple weeks if not improved.

## 2014-11-10 ENCOUNTER — Telehealth: Payer: Self-pay | Admitting: Cardiology

## 2014-11-10 NOTE — Telephone Encounter (Signed)
Spoke with patient, reminded her of conversation with Charlotte Sanes, RN from Wednesday. Patient has c/o increased fatigue, concern with her kidney disease, and increased trips to the bathroom-which she states is a sign of increased blood sugar.   Patient stated she did not want to continue taking Atenolol. She has spoken to [facility] about this and wants another prior authorization done.  Note routed to [facility].  I have provided her with samples of Bystolic so she won't go without blood pressure medicine.  Medication samples have been provided to the patient. Drug name: Bystolic 5 mg Qty: 14 tabs LOT: [id number]  Exp.Date: 10/2016 Samples left at front desk for patient pick-up. Patient notified.  [place], Chelley 5:27 PM 11/10/2014

## 2014-11-10 NOTE — Telephone Encounter (Signed)
Pt need a prior authorization for her Bystolic. Please call [employer]-[id number]. Pt would like some samples of Bystolic until she gets her medicine please.

## 2014-11-24 ENCOUNTER — Ambulatory Visit (HOSPITAL_BASED_OUTPATIENT_CLINIC_OR_DEPARTMENT_OTHER): Payer: BLUE CROSS/BLUE SHIELD | Admitting: Oncology

## 2014-11-24 ENCOUNTER — Other Ambulatory Visit (HOSPITAL_BASED_OUTPATIENT_CLINIC_OR_DEPARTMENT_OTHER): Payer: BLUE CROSS/BLUE SHIELD

## 2014-11-24 ENCOUNTER — Telehealth: Payer: Self-pay | Admitting: Oncology

## 2014-11-24 VITALS — BP 145/66 | HR 62 | Temp 98.0°F | Resp 18 | Ht 65.0 in | Wt 199.5 lb

## 2014-11-24 DIAGNOSIS — Z85038 Personal history of other malignant neoplasm of large intestine: Secondary | ICD-10-CM | POA: Diagnosis not present

## 2014-11-24 DIAGNOSIS — D509 Iron deficiency anemia, unspecified: Secondary | ICD-10-CM | POA: Diagnosis not present

## 2014-11-24 DIAGNOSIS — C180 Malignant neoplasm of cecum: Secondary | ICD-10-CM

## 2014-11-24 NOTE — Progress Notes (Signed)
  [place] OFFICE PROGRESS NOTE   Diagnosis: Colon cancer  INTERVAL HISTORY:   She returns as scheduled. She underwent genetic testing last fall and no mutation was found in the MLH1 gene. The genetics counselor recommended she be treated as though she has Lynch syndrome based on her personal and family history of cancer.  She reports a 3 to four-month history of right "hip "discomfort. She is been diagnosed with arthritis and bursitis and is being followed by orthopedics. She is scheduled for an injection next week.  Objective:  Vital signs in last 24 hours:  Blood pressure 145/66, pulse 62, temperature 98 F (36.7 C), temperature source Oral, resp. rate 18, height $RemoveBe'5\' 5"'hGSdkWDbi$  (1.651 m), weight 199 lb 8 oz (90.493 kg), SpO2 96 %.    HEENT: Neck without mass Lymphatics: No cervical, supra-clavicular, axillary, or inguinal nodes Resp: Lungs clear bilaterally Cardio: Regular rate and rhythm GI: No hepatosplenomegaly, nontender, no mass Vascular: No leg edema Musculoskeletal: Pain with motion at the right hip      Lab Results:   Lab Results  Component Value Date   CEA 2.1 05/04/2014    Imaging:  No results found.  Medications: I have reviewed the patient's current medications.  Assessment/Plan: 1. 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 History of uterine cancer-2006  3. history of glomerulonephritis with chronic renal insufficiency  4. family history of multiple cancers including colon cancer  5. 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 Hypertension  7. History of anemia-microcytic, likely secondary to colon cancer-she will discontinue iron therapy    Disposition:  [name] remains in clinical remission from colon cancer. She plans to undergo  screening as if she has HNPCC despite the negative gene testing. She would like a referral to [facility] gastroenterology for a colonoscopy and to consider the indication for upper an endoscopy.  She will return for an office visit and CEA in 6 months. She will be sure her sister has colonoscopy screening.  Betsy Coder, MD  11/24/2014  11:14 AM

## 2014-11-24 NOTE — Telephone Encounter (Signed)
Confirm appointment for September. Mailed calendar. See referral notes.

## 2014-11-25 LAB — CEA: CEA: 2.5 ng/mL (ref 0.0–5.0)

## 2014-11-27 ENCOUNTER — Telehealth: Payer: Self-pay | Admitting: *Deleted

## 2014-11-27 NOTE — Telephone Encounter (Signed)
-----   Message from [name], MD sent at 11/25/2014 11:25 AM EST ----- Please call patient, [name] is normal

## 2014-11-27 NOTE — Telephone Encounter (Signed)
Per Dr. Sherrill; notified pt that cea is normal.  Pt verbalized understanding and expressed appreciation for call. 

## 2014-12-04 ENCOUNTER — Telehealth: Payer: Self-pay | Admitting: Cardiology

## 2014-12-04 NOTE — Telephone Encounter (Signed)
Forwarded to Ivin Booty, RN to advise   See telephone encounters - 2/12, 2/24, 2/26

## 2014-12-04 NOTE — Telephone Encounter (Signed)
Pt would like to know the status of her prior authorization for Bystolic.

## 2014-12-04 NOTE — Telephone Encounter (Signed)
SPOKE TO DR HARDING IF ANOTHER BETA BLOCKER IS NEEDED TO TRY BEFORE THE USE OF BYSTOLIC  CARVEDILOL 2.376 MG BID WILL DISCUSS WITH PATIENT 12/05/14

## 2014-12-05 NOTE — Telephone Encounter (Signed)
Spoke to patient She states she stopped the atenolol due to extreme fatique. She does not want to try any other medication.She would like to restart BYSTOLIC 2.5 mg She states she contacted her insurance company and informed them of her decision. She states that the representative placed information into her record. She would like another prior authoriatization to be done She request samples. RN informed patient will start another prior auth.- will contact her back No samples available. Patient verbalized understanding.

## 2014-12-05 NOTE — Telephone Encounter (Signed)
Form present and will be filled out awaiting for Dr Ellyn Hack signature

## 2014-12-05 NOTE — Telephone Encounter (Signed)
RN spoke to representative (angela) Informed ANGELA S.,need to restart another prior auth. For [employer] S. states since a denial has been given- the next step is a PCR (form documenting what has been tried) or peer to peer Clinical cytogeneticist and cardiologist -schdeule time-), patient appeal. RN informed Laurell Roof. to fax form. She states it may 7-10 days for a response. Awaiting for form

## 2014-12-06 ENCOUNTER — Telehealth: Payer: Self-pay | Admitting: Gastroenterology

## 2014-12-06 MED ORDER — NEBIVOLOL HCL 5 MG PO TABS: 5.0000 mg | ORAL_TABLET | Freq: Every day | ORAL | Status: DC

## 2014-12-06 NOTE — Telephone Encounter (Signed)
Records received from [facility] for Dr. Ardis Hughs to reviewed.

## 2014-12-06 NOTE — Telephone Encounter (Signed)
Spoke to patient.informed patient of the progress of prior auth. Samples available for pick up   patient states she will pick up after 12/19/13- she will be out of town until then.

## 2014-12-25 NOTE — Telephone Encounter (Signed)
LATE ENTRY  FAXED ON 12/21/14-- PRIOR AUTHORIZATION "PCR"- BYSTOLIC

## 2014-12-26 ENCOUNTER — Encounter: Payer: Self-pay | Admitting: Gastroenterology

## 2014-12-28 NOTE — Telephone Encounter (Signed)
RN called-Request has been received by [employer]- still awaiting for determination-7-10 business days

## 2014-12-29 NOTE — Telephone Encounter (Signed)
Received notification Bystolic was approved by provider courtesy review-- Patient and pharmacy made aware

## 2015-01-04 ENCOUNTER — Other Ambulatory Visit: Payer: Self-pay | Admitting: General Surgery

## 2015-01-04 DIAGNOSIS — R109 Unspecified abdominal pain: Secondary | ICD-10-CM

## 2015-01-09 ENCOUNTER — Other Ambulatory Visit: Payer: BLUE CROSS/BLUE SHIELD

## 2015-01-16 ENCOUNTER — Telehealth: Payer: Self-pay

## 2015-01-16 NOTE — Telephone Encounter (Signed)
Pt called at 436 asking if she can be seen sooner than 5/16 with Dr Benay Spice. She is having nausea, abd discomfort, back pain and pain on r side of leg. I called pt back and discussed w/patient. She feels it is not urgent but would like to see Dr Benay Spice sooner if possible. Only availability was 5/5 and she is out of town that day. Offered [facility] and she said it wasn't urgent. Offered possibility of seeing Lattie Haw and she prefers Dr Benay Spice. She stated she would call if things got worse before 5/16.

## 2015-01-18 ENCOUNTER — Ambulatory Visit: Payer: BLUE CROSS/BLUE SHIELD | Admitting: Oncology

## 2015-01-18 NOTE — Telephone Encounter (Signed)
Called pt with instructions to contact PCP to have abdominal pain and nausea evaluated. She reports she contacted an orthopedist for back pain. Requests to be called if a sooner appointment with Dr. Benay Spice becomes available. Pt is aware of [facility] option.

## 2015-01-29 ENCOUNTER — Ambulatory Visit: Payer: BLUE CROSS/BLUE SHIELD | Admitting: Oncology

## 2015-01-29 ENCOUNTER — Ambulatory Visit (HOSPITAL_BASED_OUTPATIENT_CLINIC_OR_DEPARTMENT_OTHER): Payer: BLUE CROSS/BLUE SHIELD | Admitting: Oncology

## 2015-01-29 VITALS — BP 154/64 | HR 67 | Temp 97.7°F | Resp 18 | Ht 65.0 in | Wt 204.6 lb

## 2015-01-29 DIAGNOSIS — C180 Malignant neoplasm of cecum: Secondary | ICD-10-CM

## 2015-01-29 DIAGNOSIS — Z8541 Personal history of malignant neoplasm of cervix uteri: Secondary | ICD-10-CM

## 2015-01-29 DIAGNOSIS — Z800 Family history of malignant neoplasm of digestive organs: Secondary | ICD-10-CM

## 2015-01-29 DIAGNOSIS — Z862 Personal history of diseases of the blood and blood-forming organs and certain disorders involving the immune mechanism: Secondary | ICD-10-CM | POA: Diagnosis not present

## 2015-01-29 NOTE — Progress Notes (Signed)
  [place] OFFICE PROGRESS NOTE   Diagnosis: Colon cancer  INTERVAL HISTORY:   [name] returns prior to a scheduled visit. She reports being diagnosed with an incisional hernia by Dr. Marlou Starks. She is considering having surgery. She complains of pain at the right "hip and leg. She has been diagnosed with lumbar disc disease and is followed by orthopedics. She was started on prednisone earlier today.  She plans to follow screening for HNPCC and is scheduled to see Dr. Ardis Hughs next month to discuss a surveillance colonoscopy.  Objective:  Vital signs in last 24 hours:  Blood pressure 154/64, pulse 67, temperature 97.7 F (36.5 C), temperature source Oral, resp. rate 18, height _0  (1.651 m), weight 204 lb 9.6 oz (92.806 kg), SpO2 97 %.    HEENT: Neck without mass Lymphatics: No cervical, supra-clavicular, axillary, or inguinal nodes Resp: Lungs clear bilaterally Cardio: Regular rate and rhythm GI: No hepatomegaly, no mass, reducible ventral hernia at the upper aspect of the midline incision Vascular: Trace edema at the right greater than left lower leg   Lab Results  Component Value Date   CEA 2.5 11/24/2014    Medications: I have reviewed the patient's current medications.  Assessment/Plan: 1. 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 History of uterine cancer-2006  3. history of glomerulonephritis with chronic renal insufficiency  4. family history of multiple cancers including colon cancer  5. 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 Hypertension  7. History of anemia-microcytic, likely secondary to colon cancer-she will discontinue iron therapy  8.   Abdominal incisional hernia   [name] remains in clinical remission from colon cancer. She will see  Dr. Ardis Hughs next month to plan a surveillance colonoscopy. She will undergo a CT evaluation per Dr. Marlou Starks prior to considering a hernia repair procedure. She will return for an office visit and CEA/CBC as scheduled in September.    Betsy Coder, MD  01/29/2015  3:24 PM

## 2015-02-13 ENCOUNTER — Other Ambulatory Visit: Payer: Self-pay | Admitting: Orthopedic Surgery

## 2015-02-13 DIAGNOSIS — M545 Low back pain: Secondary | ICD-10-CM

## 2015-03-03 ENCOUNTER — Ambulatory Visit
Admission: RE | Admit: 2015-03-03 | Discharge: 2015-03-03 | Disposition: A | Discharge status: Home / Self Care | Payer: BLUE CROSS/BLUE SHIELD | Source: Ambulatory Visit | Attending: Orthopedic Surgery | Admitting: Orthopedic Surgery

## 2015-03-03 DIAGNOSIS — M545 Low back pain: Secondary | ICD-10-CM

## 2015-03-06 ENCOUNTER — Ambulatory Visit (INDEPENDENT_AMBULATORY_CARE_PROVIDER_SITE_OTHER): Payer: BLUE CROSS/BLUE SHIELD | Admitting: Gastroenterology

## 2015-03-06 ENCOUNTER — Encounter: Payer: Self-pay | Admitting: Gastroenterology

## 2015-03-06 VITALS — BP 138/82 | HR 68 | Ht 63.75 in | Wt 202.0 lb

## 2015-03-06 DIAGNOSIS — Z1509 Genetic susceptibility to other malignant neoplasm: Secondary | ICD-10-CM

## 2015-03-06 NOTE — Progress Notes (Signed)
Review of pertinent gastrointestinal problems: 1.  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 Marlou Starks. 04/10/2014; Microsatellite instability-high, loss of MLH1 and PMS2 expression; Negative mutation testing of the MLH1 gene.  Cancer was discovered by colonoscopy Dr. Teena Irani 02/2014.  She has had 4 family members with colon cancer including sister at 53, father in 53s.  Also a cousing   Screening recommendations for HNPCC patients (from UpToDate)  .Annual colonoscopy starting between the ages of 62 and 32 years, or two to five years prior to the earliest age of CRC diagnosis in the family, whichever comes first. In families with MSH6 or PMS2 mutations, screening can start at age 74 to 50 or two to five years prior to the earliest CRC in the family, unless an early-onset CRC has been diagnosed in a given family. .Annual screening for endometrial and ovarian cancer with pelvic examination, endometrial biopsy, and transvaginal ultrasound beginning at age 26 to 67 years, or three to five years earlier than the earliest age of diagnosis of these cancers in the family (whichever is earlier). We offer prophylactic hysterectomy and salpingo-oophorectomy in women with Lynch syndrome at the end of childbearing or at age 25 years. Marland KitchenUpper endoscopy with biopsy of the gastric antrum starting at 30 to 35 years and treatment of Helicobacter pylori infection when found on biopsy. We perform a repeat upper endoscopy every two to three years in individuals with risk factors for gastric cancer. We carefully inspect the distal duodenum and terminal ileum for small intestinal cancers during upper endoscopy and colonoscopy, respectively. .Annual urinalysis examination beginning at age 39 to 65 years. .Annual physical examination including careful skin and neurological examination beginning at age 28 to 56 years.   HPI: This is a  very pleasant 64 year old woman  who  was referred to me by [name], MD to evaluate  Lynch syndrome, personal history of colon cancer  Chief complaint is Lynch syndrome, colon cancer 2015  She recovered from surgery last year.  Has post operative hernia that is due to be repaired.  She has had endometrial cancer status post total abdominal hysterectomy, oophorectomy.  Her sister had colon cancer at the age of 23. Her father had colon cancer.  Review of systems: Pertinent positive and negative review of systems were noted in the above HPI section. Complete review of systems was performed and was otherwise normal.   Past Medical History  Diagnosis Date  . History of endometrial cancer   . HLD (hyperlipidemia)   . Hypothyroidism     subclinical  . CKD (chronic kidney disease), stage III     GFR 30-59  . H/O Guillain-Barre syndrome     mid-late 1980's  . Post-streptococcal glomerulonephritis   . Heart murmur   . Asthma in adult     seasonal mild  (allergies) , intermittent   . Anemia     Unclear etiology  . Cancer     endometrial- tx/w hysterectomy  . YYTKPTWS(568.1)     last difficult headache - early June - 2015  . GERD (gastroesophageal reflux disease)     sick in May 2015, multiple complaints, but one being "pushing up sensation in her chest, abdomen region", followed by cardiac at first for clearance for colonscopy- started on Prilosec temporarily, but hasn't taken in 2 weeks    . Hypertension     recent cardiac consult - 01/2014 done for compalints relative to nausea & breathing changes,  clearance for colonoscopy   . Colon polyps 12/2002  . TMJ (dislocation of temporomandibular joint)   . Colon cancer 02/22/2014    Past Surgical History  Procedure Laterality Date  . Total abdominal hysterectomy w/ bilateral salpingoophorectomy    . Exploratory laparotomy    . Lymphadenectomy      paraaortic and pelvic  . Colonoscopy w/ polypectomy    . Renal biopsy    . Tonsillectomy and adenoidectomy    . Knee  arthroscopy Right   . Nm myoview ltd  02/03/2014    LOW RISK: Exercise 6:51 / 7 METS; + EKG for Ischemia, No CP; Images negative for ischemia or infarction  . Transthoracic echocardiogram  02/03/2014    EF 55-60%. No regional WMA, Gr 3 DD (High filling pressures); Mod LA dilation  . 48 hr holter monitor  01/2014    NSR with intermittent PVCs; no arrhythmia  . Abdominal hysterectomy    . Right colectomy  04/10/2014    DR TOTH  . Laparoscopic right colectomy Right 04/10/2014    Procedure: LAPAROSCOPIC ASSISTED  RIGHT COLECTOMY;  Surgeon: Merrie Roof, MD;  Location: [facility];  Service: General;  Laterality: Right;    Current Outpatient Prescriptions  Medication Sig Dispense Refill  . aspirin EC 81 MG tablet Take 81 mg by mouth daily.    Marland Kitchen atorvastatin (LIPITOR) 10 MG tablet Take 10 mg by mouth at bedtime.     . cyclobenzaprine (FLEXERIL) 5 MG tablet Take 5 mg by mouth as needed.   0  . diltiazem (CARDIZEM CD) 240 MG 24 hr capsule Take 240 mg by mouth daily before breakfast.     . furosemide (LASIX) 40 MG tablet Take 40 mg by mouth daily before breakfast.     . meloxicam (MOBIC) 7.5 MG tablet take 1 tablet by mouth daily with food for 7 days then if needed (pt takes as needed)  0  . Multiple Vitamin (MULTIVITAMIN) capsule Take 1 capsule by mouth daily.    . naproxen sodium (ANAPROX) 220 MG tablet Take 220 mg by mouth daily as needed.     . nebivolol (BYSTOLIC) 2.5 MG tablet Take 2.5 mg by mouth daily.    . traMADol (ULTRAM) 50 MG tablet take 1-2 tablets by mouth three times a day if needed for pain  0   No current facility-administered medications for this visit.    Allergies as of 03/06/2015 - Review Complete 03/06/2015  Allergen Reaction Noted  . Sulfa antibiotics Rash     Family History  Problem Relation Age of Onset  . Colon cancer Father 69  . Valvular heart disease Father   . Colon cancer Sister 37  . Pancreatic cancer Maternal Grandmother 73  . Colon cancer Maternal  Grandfather 77  . Breast cancer Paternal Aunt 15  . Colon cancer Cousin 54    pat female first cousin  . Colon polyps Father   . Colonic polyp Sister   . Esophageal cancer Neg Hx   . Diabetes Mother   . Diabetes Sister     History   Social History  . Marital Status: Single    Spouse Name: N/A  . Number of Children: 0  . Years of Education: N/A   Occupational History  . [place] History Main Topics  . Smoking status: Never Smoker   . Smokeless tobacco: Never Used  . Alcohol Use: Yes     Comment: social- monthly   .  Drug Use: No  . Sexual Activity: Not on file   Other Topics Concern  . Not on file   Social History Narrative   Single. Self-employed; [employer].   Never smoked. Social EtOH.   Works out at a gym 1-2 days a week for one half hours. She hasn't been able to do her exercise as much as anticipated over the past year because she has been caring for her parents.  Her father died many of last year, and her mother died in 10/23/2022.  She's been under significant stress in the past year.     Physical Exam: BP 138/82 mmHg  Pulse 68  Ht 5' 3.75" (1.619 m)  Wt 202 lb (91.627 kg)  BMI 34.96 kg/m2 Constitutional: generally well-appearing Psychiatric: alert and oriented x3 Eyes: extraocular movements intact Mouth: oral pharynx moist, no lesions Neck: supple no lymphadenopathy Cardiovascular: heart regular rate and rhythm Lungs: clear to auscultation bilaterally Abdomen: soft, nontender, nondistended, no obvious ascites, no peritoneal signs, normal bowel sounds Extremities: no lower extremity edema bilaterally Skin: no lesions on visible extremities   Assessment and plan: 64 y.o. female with  Lynch syndrome, personal history of colon cancer resected 2015  Her cecal colon cancer was diagnosed 2-1/2 years after a normal colonoscopy. I suggested to her that we should not have any lapses in colonoscopy for more  than 2 years. She is due around this time for repeat colonoscopy. She also requires upper endoscopy for her new diagnosis of Lynch syndrome. These will be done on a periodic basis, not necessarily every 2 years that she gets a colonoscopy. She wants to coordinate the timing of her colonoscopy and upper endoscopy around the time of a likely abdominal hernia repair. We will work with her surgeon's office, Dr. Marlou Starks about coordinating this. I'm happy to do that.   Owens Loffler, MD [facility] 03/06/2015, 9:26 AM  Cc: [name], MD

## 2015-03-06 NOTE — Patient Instructions (Addendum)
You will be set up for a colonoscopy for Lynch Syndrome and history of colon cancer 2015. This should be done about every 2 years. You will be set up for an upper endoscopy for Lynch Syndrome screening. This will be done periodically afterwards.  Both of these procedures will be coordinated with Dr. Ethlyn Gallery office so that they can be done the day prior. This will be in October per patient preference.

## 2015-03-07 ENCOUNTER — Telehealth: Payer: Self-pay | Admitting: Gastroenterology

## 2015-03-07 NOTE — Telephone Encounter (Signed)
Per Dr Ardis Hughs note the pt is to call us after Dr Marlou Starks schedules the surgery and then call us and we will try and coordinate the procedures.

## 2015-03-07 NOTE — Telephone Encounter (Signed)
Pt has been notified that we have made a note about the date.

## 2015-03-16 ENCOUNTER — Other Ambulatory Visit: Payer: Self-pay | Admitting: Orthopedic Surgery

## 2015-03-16 DIAGNOSIS — M533 Sacrococcygeal disorders, not elsewhere classified: Principal | ICD-10-CM

## 2015-03-16 DIAGNOSIS — G8929 Other chronic pain: Secondary | ICD-10-CM

## 2015-03-21 ENCOUNTER — Other Ambulatory Visit: Payer: Self-pay | Admitting: General Surgery

## 2015-03-21 ENCOUNTER — Ambulatory Visit
Admission: RE | Admit: 2015-03-21 | Discharge: 2015-03-21 | Disposition: A | Discharge status: Home / Self Care | Payer: BLUE CROSS/BLUE SHIELD | Source: Ambulatory Visit | Attending: General Surgery | Admitting: General Surgery

## 2015-03-21 DIAGNOSIS — R109 Unspecified abdominal pain: Secondary | ICD-10-CM

## 2015-03-21 MED ORDER — IOPAMIDOL (ISOVUE-300) INJECTION 61%
80.0000 mL | Freq: Once | INTRAVENOUS | Status: AC | PRN
  Administered 2015-03-21: 80 mL via INTRAVENOUS

## 2015-03-21 MED ORDER — IOPAMIDOL (ISOVUE-300) INJECTION 61%: 100.0000 mL | Freq: Once | INTRAVENOUS | Status: DC | PRN

## 2015-03-22 ENCOUNTER — Telehealth: Payer: Self-pay | Admitting: Gastroenterology

## 2015-03-22 NOTE — Telephone Encounter (Signed)
The pt wants to set up her colon prior to or the next day after surgery.  She will call back with the surgical date and we will try and set up colon around that time.

## 2015-03-27 ENCOUNTER — Ambulatory Visit
Admission: RE | Admit: 2015-03-27 | Discharge: 2015-03-27 | Disposition: A | Discharge status: Home / Self Care | Payer: BLUE CROSS/BLUE SHIELD | Source: Ambulatory Visit | Attending: Orthopedic Surgery | Admitting: Orthopedic Surgery

## 2015-03-27 ENCOUNTER — Telehealth: Payer: Self-pay | Admitting: Gastroenterology

## 2015-03-27 ENCOUNTER — Other Ambulatory Visit: Payer: Self-pay | Admitting: General Surgery

## 2015-03-27 DIAGNOSIS — G8929 Other chronic pain: Secondary | ICD-10-CM

## 2015-03-27 DIAGNOSIS — M533 Sacrococcygeal disorders, not elsewhere classified: Principal | ICD-10-CM

## 2015-03-27 MED ORDER — IOHEXOL 180 MG/ML  SOLN: 1.0000 mL | Freq: Once | INTRAMUSCULAR | Status: AC | PRN

## 2015-03-27 NOTE — Telephone Encounter (Signed)
Patty,  See below

## 2015-03-28 NOTE — Telephone Encounter (Signed)
Pt has been scheduled for endo colon and pre visit she is aware

## 2015-04-27 ENCOUNTER — Ambulatory Visit (AMBULATORY_SURGERY_CENTER): Payer: Self-pay

## 2015-04-27 DIAGNOSIS — C189 Malignant neoplasm of colon, unspecified: Secondary | ICD-10-CM

## 2015-04-27 NOTE — Progress Notes (Signed)
No allergies to eggs or soy No diet/weight loss meds No home oxygen No past problems with anesthesia  Refused emmi 

## 2015-05-04 ENCOUNTER — Ambulatory Visit (AMBULATORY_SURGERY_CENTER): Payer: BLUE CROSS/BLUE SHIELD | Admitting: Gastroenterology

## 2015-05-04 ENCOUNTER — Encounter: Payer: Self-pay | Admitting: Gastroenterology

## 2015-05-04 VITALS — BP 128/66 | HR 69 | Temp 98.3°F | Resp 21 | Ht 63.0 in | Wt 202.0 lb

## 2015-05-04 DIAGNOSIS — C189 Malignant neoplasm of colon, unspecified: Secondary | ICD-10-CM

## 2015-05-04 DIAGNOSIS — D125 Benign neoplasm of sigmoid colon: Secondary | ICD-10-CM | POA: Diagnosis not present

## 2015-05-04 DIAGNOSIS — K635 Polyp of colon: Secondary | ICD-10-CM

## 2015-05-04 DIAGNOSIS — Z1509 Genetic susceptibility to other malignant neoplasm: Secondary | ICD-10-CM

## 2015-05-04 MED ORDER — SODIUM CHLORIDE 0.9 % IV SOLN: 500.0000 mL | INTRAVENOUS | Status: DC

## 2015-05-04 NOTE — Op Note (Signed)
[facility]  [address]. [place], [id number]   COLONOSCOPY PROCEDURE REPORT  PATIENT: [name], [name]  MR#: [id number] BIRTHDATE: 07-May-1951 , 64  yrs. old GENDER: female ENDOSCOPIST: Milus Banister, MD PROCEDURE DATE:  05/04/2015 PROCEDURE:   Colonoscopy, surveillance and Colonoscopy with snare polypectomy First Screening Colonoscopy - Avg.  risk and is 50 yrs.  old or older - No.  Prior Negative Screening - Now for repeat screening. N/A  History of Adenoma - Now for follow-up colonoscopy & has been > or = to 3 yrs.  N/A  Polyps removed today? 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 Marlou Starks.  04/10/2014; Microsatellite instability-high, loss of MLH1 and PMS2 expression; Negative mutation testing of the MLH1 gene. Cancer was discovered by colonoscopy Dr.  Teena Irani 02/2014.  She has had 4 family members with colon cancer including sister at 62, father in 60s.  Also a cousin. MEDICATIONS: Monitored anesthesia care and Propofol 400 mg IV  DESCRIPTION OF PROCEDURE:   After the risks benefits and alternatives of the procedure were thoroughly explained, informed consent was obtained.  The digital rectal exam revealed no abnormalities of the rectum.   The LB [id number]  endoscope was introduced through the anus and advanced to the surgical anastomosis in right colon. No adverse events experienced.   The quality of the prep was good.  The instrument was then slowly withdrawn as the colon was fully examined. Estimated blood loss is zero unless otherwise noted in this procedure report.   COLON FINDINGS: Right hemicolectomy anastomosis was normal appearing. A sessile polyp measuring 6 mm in size was found in the sigmoid colon.  A polypectomy was performed with a cold snare.  The resection was complete, the polyp tissue was completely retrieved and sent to  histology.   The examination was otherwise normal. Retroflexed views revealed no abnormalities. The time to cecum = NA (no cecum)      Withdrawal time = NA         The scope was withdrawn and the procedure completed. COMPLICATIONS: There were no immediate complications.  ENDOSCOPIC IMPRESSION: 1.   Sessile polyp was found in the sigmoid colon; polypectomy was performed with a cold snare 2.   The examination was otherwise normal  RECOMMENDATIONS: Await biopsy results You will likely need repeat in 12-18 months  eSigned:  Milus Banister, MD 05/04/2015 3:37 PM

## 2015-05-04 NOTE — Progress Notes (Signed)
To recovery, report to McCoy, RN, VSS 

## 2015-05-04 NOTE — Progress Notes (Signed)
Called to room to assist during endoscopic procedure.  Patient ID and intended procedure confirmed with present staff. Received instructions for my participation in the procedure from the performing physician.  

## 2015-05-04 NOTE — Op Note (Signed)
[facility]  [address]. [place], [id number]   ENDOSCOPY PROCEDURE REPORT  PATIENT: Erin, Colon  MR#: [id number] BIRTHDATE: [birth year] , 64  yrs. old GENDER: female ENDOSCOPIST: Milus Banister, MD PROCEDURE DATE:  05/04/2015 PROCEDURE:  EGD, screening ASA CLASS:     Class II INDICATIONS:  Lynch syndrome. MEDICATIONS: Monitored anesthesia care, Propofol 150 mg IV, and Residual sedation present TOPICAL ANESTHETIC: none  DESCRIPTION OF PROCEDURE: After the risks benefits and alternatives of the procedure were thoroughly explained, informed consent was obtained.  The LB [id number] endoscope was introduced through the mouth and advanced to the second portion of the duodenum , Without limitations.  The instrument was slowly withdrawn as the mucosa was fully examined.    EXAM: The esophagus and gastroesophageal junction were completely normal in appearance.  The stomach was entered and closely examined.The antrum, angularis, and lesser curvature were well visualized, including a retroflexed view of the cardia and fundus. The stomach wall was normally distensable.  The scope passed easily through the pylorus into the duodenum.  Retroflexed views revealed a hiatal hernia and Retroflexed views revealed .     The scope was then withdrawn from the patient and the procedure completed.  COMPLICATIONS: There were no immediate complications.  ENDOSCOPIC IMPRESSION: Normal appearing esophagus and GE junction (excpet for small hiatal hernia) , the stomach was well visualized and normal in appearance, normal appearing duodenum  RECOMMENDATIONS: Repeat EGD in 3-4 years.   eSigned:  Milus Banister, MD 05/04/2015 3:43 PM

## 2015-05-04 NOTE — Patient Instructions (Signed)
Discharge instructions given. Biopsies taken. Resume previous medications. YOU HAD AN ENDOSCOPIC PROCEDURE TODAY AT [facility]:   Refer to the procedure report that was given to you for any specific questions about what was found during the examination.  If the procedure report does not answer your questions, please call your gastroenterologist to clarify.  If you requested that your care partner not be given the details of your procedure findings, then the procedure report has been included in a sealed envelope for you to review at your convenience later.  YOU SHOULD EXPECT: Some feelings of bloating in the abdomen. Passage of more gas than usual.  Walking can help get rid of the air that was put into your GI tract during the procedure and reduce the bloating. If you had a lower endoscopy (such as a colonoscopy or flexible sigmoidoscopy) you may notice spotting of blood in your stool or on the toilet paper. If you underwent a bowel prep for your procedure, you may not have a normal bowel movement for a few days.  Please Note:  You might notice some irritation and congestion in your nose or some drainage.  This is from the oxygen used during your procedure.  There is no need for concern and it should clear up in a day or so.  SYMPTOMS TO REPORT IMMEDIATELY:   Following lower endoscopy (colonoscopy or flexible sigmoidoscopy):  Excessive amounts of blood in the stool  Significant tenderness or worsening of abdominal pains  Swelling of the abdomen that is new, acute  Fever of 100F or higher   Following upper endoscopy (EGD)  Vomiting of blood or coffee ground material  New chest pain or pain under the shoulder blades  Painful or persistently difficult swallowing  New shortness of breath  Fever of 100F or higher  Black, tarry-looking stools  For urgent or emergent issues, a gastroenterologist can be reached at any hour by calling [phone].   DIET: Your first meal  following the procedure should be a small meal and then it is ok to progress to your normal diet. Heavy or fried foods are harder to digest and may make you feel nauseous or bloated.  Likewise, meals heavy in dairy and vegetables can increase bloating.  Drink plenty of fluids but you should avoid alcoholic beverages for 24 hours.  ACTIVITY:  You should plan to take it easy for the rest of today and you should NOT DRIVE or use heavy machinery until tomorrow (because of the sedation medicines used during the test).    FOLLOW UP: Our staff will call the number listed on your records the next business day following your procedure to check on you and address any questions or concerns that you may have regarding the information given to you following your procedure. If we do not reach you, we will leave a message.  However, if you are feeling well and you are not experiencing any problems, there is no need to return our call.  We will assume that you have returned to your regular daily activities without incident.  If any biopsies were taken you will be contacted by phone or by letter within the next 1-3 weeks.  Please call us at [phone] if you have not heard about the biopsies in 3 weeks.    SIGNATURES/CONFIDENTIALITY: You and/or your care partner have signed paperwork which will be entered into your electronic medical record.  These signatures attest to the fact that that the information above  on your After Visit Summary has been reviewed and is understood.  Full responsibility of the confidentiality of this discharge information lies with you and/or your care-partner.

## 2015-05-07 ENCOUNTER — Telehealth: Payer: Self-pay | Admitting: *Deleted

## 2015-05-07 NOTE — Telephone Encounter (Signed)
  Follow up Call-  Call back number 05/04/2015  Post procedure Call Back phone  # [phone]  Permission to leave phone message Yes     Patient questions:  Do you have a fever, pain , or abdominal swelling? No. Pain Score  0 *  Have you tolerated food without any problems? Yes.    Have you been able to return to your normal activities? Yes.    Do you have any questions about your discharge instructions: Diet   No. Medications  No. Follow up visit  No.  Do you have questions or concerns about your Care? No.  Actions: * If pain score is 4 or above: No action needed, pain <4.

## 2015-05-08 ENCOUNTER — Encounter: Payer: Self-pay | Admitting: Gastroenterology

## 2015-05-15 ENCOUNTER — Other Ambulatory Visit (HOSPITAL_BASED_OUTPATIENT_CLINIC_OR_DEPARTMENT_OTHER): Payer: BLUE CROSS/BLUE SHIELD

## 2015-05-15 ENCOUNTER — Other Ambulatory Visit: Payer: Self-pay | Admitting: *Deleted

## 2015-05-15 ENCOUNTER — Ambulatory Visit (HOSPITAL_BASED_OUTPATIENT_CLINIC_OR_DEPARTMENT_OTHER): Payer: BLUE CROSS/BLUE SHIELD | Admitting: Oncology

## 2015-05-15 ENCOUNTER — Ambulatory Visit (HOSPITAL_COMMUNITY)
Admission: RE | Admit: 2015-05-15 | Discharge: 2015-05-15 | Disposition: A | Discharge status: Home / Self Care | Payer: BLUE CROSS/BLUE SHIELD | Source: Ambulatory Visit | Attending: Oncology | Admitting: Oncology

## 2015-05-15 ENCOUNTER — Telehealth: Payer: Self-pay | Admitting: Oncology

## 2015-05-15 ENCOUNTER — Ambulatory Visit (HOSPITAL_BASED_OUTPATIENT_CLINIC_OR_DEPARTMENT_OTHER): Payer: BLUE CROSS/BLUE SHIELD

## 2015-05-15 VITALS — BP 168/66 | HR 62 | Temp 97.6°F | Resp 18 | Ht 63.0 in | Wt 204.3 lb

## 2015-05-15 DIAGNOSIS — C180 Malignant neoplasm of cecum: Secondary | ICD-10-CM | POA: Diagnosis not present

## 2015-05-15 DIAGNOSIS — Z8541 Personal history of malignant neoplasm of cervix uteri: Secondary | ICD-10-CM | POA: Diagnosis not present

## 2015-05-15 DIAGNOSIS — I82441 Acute embolism and thrombosis of right tibial vein: Secondary | ICD-10-CM | POA: Diagnosis not present

## 2015-05-15 DIAGNOSIS — I82531 Chronic embolism and thrombosis of right popliteal vein: Secondary | ICD-10-CM | POA: Diagnosis not present

## 2015-05-15 DIAGNOSIS — Z862 Personal history of diseases of the blood and blood-forming organs and certain disorders involving the immune mechanism: Secondary | ICD-10-CM

## 2015-05-15 DIAGNOSIS — M79604 Pain in right leg: Secondary | ICD-10-CM | POA: Insufficient documentation

## 2015-05-15 DIAGNOSIS — M7989 Other specified soft tissue disorders: Secondary | ICD-10-CM | POA: Insufficient documentation

## 2015-05-15 LAB — CBC WITH DIFFERENTIAL/PLATELET
BASO%: 0.9 % (ref 0.0–2.0)
BASOS ABS: 0.1 10*3/uL (ref 0.0–0.1)
EOS%: 2.9 % (ref 0.0–7.0)
Eosinophils Absolute: 0.2 10*3/uL (ref 0.0–0.5)
HCT: 36.5 % (ref 34.8–46.6)
HGB: 12 g/dL (ref 11.6–15.9)
LYMPH%: 25.5 % (ref 14.0–49.7)
MCH: 28.4 pg (ref 25.1–34.0)
MCHC: 32.8 g/dL (ref 31.5–36.0)
MCV: 86.6 fL (ref 79.5–101.0)
MONO#: 0.4 10*3/uL (ref 0.1–0.9)
MONO%: 6.5 % (ref 0.0–14.0)
NEUT%: 64.2 % (ref 38.4–76.8)
NEUTROS ABS: 4.1 10*3/uL (ref 1.5–6.5)
PLATELETS: 282 10*3/uL (ref 145–400)
RBC: 4.22 10*6/uL (ref 3.70–5.45)
RDW: 14.4 % (ref 11.2–14.5)
WBC: 6.3 10*3/uL (ref 3.9–10.3)
lymph#: 1.6 10*3/uL (ref 0.9–3.3)

## 2015-05-15 LAB — BASIC METABOLIC PANEL (CC13)
Anion Gap: 9 mEq/L (ref 3–11)
BUN: 30.2 mg/dL — AB (ref 7.0–26.0)
CALCIUM: 10.7 mg/dL — AB (ref 8.4–10.4)
CHLORIDE: 104 meq/L (ref 98–109)
CO2: 27 mEq/L (ref 22–29)
CREATININE: 1.7 mg/dL — AB (ref 0.6–1.1)
EGFR: 32 mL/min/{1.73_m2} — ABNORMAL LOW (ref >90)
Glucose: 97 mg/dl (ref 70–140)
Potassium: 4.5 mEq/L (ref 3.5–5.1)
Sodium: 140 mEq/L (ref 136–145)

## 2015-05-15 NOTE — Telephone Encounter (Signed)
Gave and printed appt sched and avs fo rpt for Feb 2017 °

## 2015-05-15 NOTE — Progress Notes (Signed)
*  Preliminary Results* Right lower extremity venous duplex completed. 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 There is no evidence of right Baker's cyst.  Preliminary results discussed with Tonya of Dr. Gearldine Shown office.  05/15/2015 3:16 PM  Maudry Mayhew, RVT, RDCS, RDMS

## 2015-05-15 NOTE — Addendum Note (Signed)
Addended by: Betsy Coder B on: 05/15/2015 04:42 PM   Modules accepted: Level of Service

## 2015-05-15 NOTE — Progress Notes (Addendum)
[facility] OFFICE PROGRESS NOTE   Diagnosis: Colon cancer  INTERVAL HISTORY:   [name] returns as scheduled. She continues to have pain in the right hip area, partially relieved with a "sacral "belt. She is scheduled undergo hernia repair by Dr. Marlou Starks next month. She underwent a surveillance upper endoscopy and colonoscopy by Dr. Ardis Hughs on 05/04/2015. The upper endoscopy was negative. A polyp was removed from the sigmoid colon. The pathology revealed a hyperplastic polyp.  She has noted swelling of the right leg. She has discomfort in the right pretibial area.  Objective:  Vital signs in last 24 hours:  Blood pressure 168/66, pulse 62, temperature 97.6 F (36.4 C), temperature source Oral, resp. rate 18, height $RemoveBe'5\' 3"'bHFDDuGvb$  (1.6 m), weight 204 lb 4.8 oz (92.67 kg), SpO2 99 %.    HEENT: Neck without mass Lymphatics: No cervical, supra-clavicular, axillary, or inguinal nodes Resp: Lungs clear bilaterally Cardio: Regular rate and rhythm GI: No hepatosplenomegaly, nontender, no mass, ventral hernia Vascular: The right lower leg is slightly larger than the left side with varicosities at the right calf. No erythema   Lab Results:  Lab Results  Component Value Date   WBC 6.3 05/15/2015   HGB 12.0 05/15/2015   HCT 36.5 05/15/2015   MCV 86.6 05/15/2015   PLT 282 05/15/2015   NEUTROABS 4.1 05/15/2015     Lab Results  Component Value Date   CEA 2.5 11/24/2014     Medications: I have reviewed the patient's current medications.  Assessment/Plan: 1. 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 History of uterine cancer-2006  3. history of glomerulonephritis with chronic renal insufficiency  4. family history of multiple cancers including colon cancer  5. history of  Guillain-Barr-1989  6. Hypertension  7. History of anemia-microcytic, likely secondary to colon cancer  8. Abdominal incisional hernia  9.   Right leg swelling 05/15/2015-Doppler confirmed a chronic DVT of the right popliteal vein and an indeterminate of the posterior tibial veins    Disposition:  [name] is including remission from colon cancer. She plans to undergo hernia repair by Dr. Marlou Starks next month. There is mild swelling of the right lower leg. We will refer her for a Doppler of the right leg to rule out a deep vein thrombosis.  She will return for an office visit, CEA, and urine cytology in 6 months.  Betsy Coder, MD  05/15/2015  1:53 PM  We received a report from the vascular lab confirming a chronic DVT of the right leg and an age indeterminate DVT of the posterior tibial vein. We will confirm with the ultrasound lab that both of these areas are on the right side.  I had further discussion with [name]. She reports noting swelling of the right leg for approximately the past 2-3 months with associated pain in the lower leg. She has no personal or family history of venous thrombosis. Her risk factors for venous thrombosis appear to be lack of mobility and obesity.  I recommend a three-month course of anticoagulation. I recommend xarelto therapy. We will check a creatinine to be sure her creatinine clearance is adequate to receive Xarelto. She will return for an office visit in approximately 2 months. We discussed the risk of bleeding with Xarelto and she agrees to proceed. The planned ventral hernia repair will  need to be delayed while on anticoagulation.

## 2015-05-16 ENCOUNTER — Telehealth: Payer: Self-pay | Admitting: *Deleted

## 2015-05-16 LAB — CEA: CEA: 2.2 ng/mL (ref 0.0–5.0)

## 2015-05-16 MED ORDER — WARFARIN SODIUM 5 MG PO TABS: 5.0000 mg | ORAL_TABLET | Freq: Every day | ORAL | Status: DC

## 2015-05-16 NOTE — Telephone Encounter (Signed)
Left message with Vascular Lab requesting clarification of Doppler result. Report was called to this RN on 8/30 at 86 with indeterminate age DVT in RLE. Progress note reads LLE.

## 2015-05-16 NOTE — Telephone Encounter (Addendum)
Received call from [facility], nurse with Dr. Jonni Sanger: Dr. Jonni Sanger agrees to monitor pt's Coumadin. If Dr. Benay Spice still needs to discuss pt, MD will be back in the office on 9/1.Dr. Benay Spice made aware. Dr. Benay Spice does wish to speak with Dr. Jonni Sanger. Left message informing Colletta [place].

## 2015-05-16 NOTE — Telephone Encounter (Signed)
Called Dr. Tamela Oddi office, she has gone for the day. Left Dr. Gearldine Shown pager # for her to call to discuss pt.  Per Dr. Benay Spice: Pt to start Coumadin 5 mg daily. No Xarelto due to kidney function. MD will discuss with PCP re: monitoring.  Rx sent electronically, called pt with instructions. We discussed maintaining a consistent diet. Call office for bleeding or unexplained bruising. Teach back used. Pt understands to take Coumadin in the evenings and that lab monitoring is required. Pt requests we make Dr. Lorrene Reid aware. This note routed to Dr. Lorrene Reid, office visit progress note has been routed as well.

## 2015-05-17 ENCOUNTER — Ambulatory Visit: Payer: BLUE CROSS/BLUE SHIELD | Admitting: Oncology

## 2015-05-17 ENCOUNTER — Other Ambulatory Visit: Payer: Self-pay | Admitting: Family Medicine

## 2015-05-17 ENCOUNTER — Other Ambulatory Visit: Payer: BLUE CROSS/BLUE SHIELD

## 2015-05-17 DIAGNOSIS — R079 Chest pain, unspecified: Secondary | ICD-10-CM

## 2015-05-18 ENCOUNTER — Other Ambulatory Visit: Payer: Self-pay | Admitting: *Deleted

## 2015-05-18 DIAGNOSIS — R911 Solitary pulmonary nodule: Secondary | ICD-10-CM | POA: Insufficient documentation

## 2015-05-22 DIAGNOSIS — Z86718 Personal history of other venous thrombosis and embolism: Secondary | ICD-10-CM | POA: Insufficient documentation

## 2015-05-28 ENCOUNTER — Inpatient Hospital Stay (HOSPITAL_COMMUNITY): Admission: RE | Admit: 2015-05-28 | Payer: BLUE CROSS/BLUE SHIELD | Source: Ambulatory Visit

## 2015-06-07 ENCOUNTER — Ambulatory Visit (HOSPITAL_COMMUNITY)
Admission: RE | Admit: 2015-06-07 | Payer: BLUE CROSS/BLUE SHIELD | Source: Ambulatory Visit | Admitting: General Surgery

## 2015-06-07 ENCOUNTER — Encounter (HOSPITAL_COMMUNITY): Admission: RE | Payer: Self-pay | Source: Ambulatory Visit

## 2015-06-07 SURGERY — REPAIR, HERNIA, VENTRAL, LAPAROSCOPY-ASSISTED
Anesthesia: General

## 2015-06-20 DIAGNOSIS — J4520 Mild intermittent asthma, uncomplicated: Secondary | ICD-10-CM | POA: Insufficient documentation

## 2015-06-22 ENCOUNTER — Ambulatory Visit: Payer: BLUE CROSS/BLUE SHIELD | Admitting: Nurse Practitioner

## 2015-06-22 ENCOUNTER — Ambulatory Visit: Payer: BLUE CROSS/BLUE SHIELD | Admitting: Cardiology

## 2015-06-22 ENCOUNTER — Ambulatory Visit: Payer: BLUE CROSS/BLUE SHIELD | Admitting: Physician Assistant

## 2015-06-25 ENCOUNTER — Other Ambulatory Visit: Payer: Self-pay

## 2015-06-25 DIAGNOSIS — Z1231 Encounter for screening mammogram for malignant neoplasm of breast: Secondary | ICD-10-CM

## 2015-07-12 ENCOUNTER — Telehealth: Payer: Self-pay | Admitting: Oncology

## 2015-07-12 ENCOUNTER — Telehealth: Payer: Self-pay | Admitting: *Deleted

## 2015-07-12 DIAGNOSIS — C180 Malignant neoplasm of cecum: Secondary | ICD-10-CM

## 2015-07-12 NOTE — Telephone Encounter (Signed)
Called and left a message with a new lab time per pof

## 2015-07-12 NOTE — Telephone Encounter (Signed)
"  I have a lab appointment in February.  I need Dr. Benay Spice to move this to the end of this year in November.  Please call me at [phone] and let me know.  I have met my deductible this year and will have a higher co-pay next year.

## 2015-07-12 NOTE — Telephone Encounter (Signed)
Per Dr. Benay Spice; notified pt that MD agreed to move labs to December and scheduler will call with new date/time.  Pt verbalized understanding and expressed appreciation for call back.

## 2015-07-25 ENCOUNTER — Ambulatory Visit: Payer: BLUE CROSS/BLUE SHIELD

## 2015-07-27 ENCOUNTER — Ambulatory Visit
Admission: RE | Admit: 2015-07-27 | Discharge: 2015-07-27 | Disposition: A | Discharge status: Home / Self Care | Payer: BLUE CROSS/BLUE SHIELD | Source: Ambulatory Visit

## 2015-07-27 DIAGNOSIS — Z1231 Encounter for screening mammogram for malignant neoplasm of breast: Secondary | ICD-10-CM

## 2015-08-15 ENCOUNTER — Encounter (HOSPITAL_COMMUNITY)
Admission: RE | Admit: 2015-08-15 | Discharge: 2015-08-15 | Disposition: A | Discharge status: Home / Self Care | Payer: BLUE CROSS/BLUE SHIELD | Source: Ambulatory Visit | Attending: General Surgery | Admitting: General Surgery

## 2015-08-15 ENCOUNTER — Encounter (HOSPITAL_COMMUNITY): Payer: Self-pay

## 2015-08-15 DIAGNOSIS — Z01818 Encounter for other preprocedural examination: Secondary | ICD-10-CM | POA: Insufficient documentation

## 2015-08-15 DIAGNOSIS — K219 Gastro-esophageal reflux disease without esophagitis: Secondary | ICD-10-CM | POA: Insufficient documentation

## 2015-08-15 DIAGNOSIS — Z01812 Encounter for preprocedural laboratory examination: Secondary | ICD-10-CM | POA: Insufficient documentation

## 2015-08-15 DIAGNOSIS — E785 Hyperlipidemia, unspecified: Secondary | ICD-10-CM | POA: Diagnosis not present

## 2015-08-15 DIAGNOSIS — N183 Chronic kidney disease, stage 3 (moderate): Secondary | ICD-10-CM | POA: Diagnosis not present

## 2015-08-15 DIAGNOSIS — J45909 Unspecified asthma, uncomplicated: Secondary | ICD-10-CM | POA: Insufficient documentation

## 2015-08-15 DIAGNOSIS — K439 Ventral hernia without obstruction or gangrene: Secondary | ICD-10-CM | POA: Insufficient documentation

## 2015-08-15 DIAGNOSIS — Z86718 Personal history of other venous thrombosis and embolism: Secondary | ICD-10-CM | POA: Diagnosis not present

## 2015-08-15 DIAGNOSIS — E039 Hypothyroidism, unspecified: Secondary | ICD-10-CM | POA: Diagnosis not present

## 2015-08-15 DIAGNOSIS — R9431 Abnormal electrocardiogram [ECG] [EKG]: Secondary | ICD-10-CM | POA: Insufficient documentation

## 2015-08-15 DIAGNOSIS — Z8542 Personal history of malignant neoplasm of other parts of uterus: Secondary | ICD-10-CM | POA: Diagnosis not present

## 2015-08-15 DIAGNOSIS — I129 Hypertensive chronic kidney disease with stage 1 through stage 4 chronic kidney disease, or unspecified chronic kidney disease: Secondary | ICD-10-CM | POA: Diagnosis not present

## 2015-08-15 DIAGNOSIS — Z7901 Long term (current) use of anticoagulants: Secondary | ICD-10-CM | POA: Insufficient documentation

## 2015-08-15 DIAGNOSIS — Z79899 Other long term (current) drug therapy: Secondary | ICD-10-CM | POA: Diagnosis not present

## 2015-08-15 DIAGNOSIS — Z85038 Personal history of other malignant neoplasm of large intestine: Secondary | ICD-10-CM | POA: Insufficient documentation

## 2015-08-15 HISTORY — DX: Peripheral vascular disease, unspecified: I73.9

## 2015-08-15 HISTORY — DX: Unspecified osteoarthritis, unspecified site: M19.90

## 2015-08-15 LAB — BASIC METABOLIC PANEL
Anion gap: 6 (ref 5–15)
BUN: 27 mg/dL — ABNORMAL HIGH (ref 6–20)
CALCIUM: 9.6 mg/dL (ref 8.9–10.3)
CO2: 26 mmol/L (ref 22–32)
CREATININE: 1.46 mg/dL — AB (ref 0.44–1.00)
Chloride: 106 mmol/L (ref 101–111)
GFR calc Af Amer: 43 mL/min — ABNORMAL LOW (ref >60)
GFR calc non Af Amer: 37 mL/min — ABNORMAL LOW (ref >60)
GLUCOSE: 110 mg/dL — AB (ref 65–99)
Potassium: 4.6 mmol/L (ref 3.5–5.1)
Sodium: 138 mmol/L (ref 135–145)

## 2015-08-15 LAB — CBC
HCT: 35.8 % — ABNORMAL LOW (ref 36.0–46.0)
Hemoglobin: 11.5 g/dL — ABNORMAL LOW (ref 12.0–15.0)
MCH: 27.8 pg (ref 26.0–34.0)
MCHC: 32.1 g/dL (ref 30.0–36.0)
MCV: 86.7 fL (ref 78.0–100.0)
Platelets: 258 10*3/uL (ref 150–400)
RBC: 4.13 MIL/uL (ref 3.87–5.11)
RDW: 14.5 % (ref 11.5–15.5)
WBC: 6.1 10*3/uL (ref 4.0–10.5)

## 2015-08-15 NOTE — Pre-Procedure Instructions (Addendum)
KATHERLEEN IMAMURA  08/15/2015      [facility], [address] [place] [place] [address] Phone: [phone] Fax: [phone]    Your procedure is scheduled on 08/23/15  Report to [facility] cone short stay admitting at 530 A.M.  Call this number if you have problems the morning of surgery:  [phone]   Remember:  Do not eat food or drink liquids after midnight.  Take these medicines the morning of surgery with A SIP OF WATER diltiazem, tramadol if needed  STOP all herbel meds, nsaids (aleve,naproxen,advil,ibuprofen) 5 days prior to surgery starting 08/18/15 including vitamins(multi), aspirin  STOP coumadin per dr 08/16/15   Do not wear jewelry, make-up or nail polish.  Do not wear lotions, powders, or perfumes.  You may wear deodorant.  Do not shave 48 hours prior to surgery.  Men may shave face and neck.  Do not bring valuables to the hospital.  [facility] is not responsible for any belongings or valuables.  Contacts, dentures or bridgework may not be worn into surgery.  Leave your suitcase in the car.  After surgery it may be brought to your room.  For patients admitted to the hospital, discharge time will be determined by your treatment team.  Patients discharged the day of surgery will not be allowed to drive home.   Name and phone number of your driver:    Special instructions:   Special Instructions: [facility] - Preparing for Surgery  Before surgery, you can play an important role.  Because skin is not sterile, your skin needs to be as free of germs as possible.  You can reduce the number of germs on you skin by washing with CHG (chlorahexidine gluconate) soap before surgery.  CHG is an antiseptic cleaner which kills germs and bonds with the skin to continue killing germs even after washing.  Please DO NOT use if you have an allergy to CHG or antibacterial soaps.  If your skin becomes  reddened/irritated stop using the CHG and inform your nurse when you arrive at Short Stay.  Do not shave (including legs and underarms) for at least 48 hours prior to the first CHG shower.  You may shave your face.  Please follow these instructions carefully:   1.  Shower with CHG Soap the night before surgery and the morning of Surgery.  2.  If you choose to wash your hair, wash your hair first as usual with your normal shampoo.  3.  After you shampoo, rinse your hair and body thoroughly to remove the Shampoo.  4.  Use CHG as you would any other liquid soap.  You can apply chg directly  to the skin and wash gently with scrungie or a clean washcloth.  5.  Apply the CHG Soap to your body ONLY FROM THE NECK DOWN.  Do not use on open wounds or open sores.  Avoid contact with your eyes ears, mouth and genitals (private parts).  Wash genitals (private parts)       with your normal soap.  6.  Wash thoroughly, paying special attention to the area where your surgery will be performed.  7.  Thoroughly rinse your body with warm water from the neck down.  8.  DO NOT shower/wash with your normal soap after using and rinsing off the CHG Soap.  9.  Pat yourself dry with a clean towel.  10.  Wear clean pajamas.            11.  Place clean sheets on your bed the night of your first shower and do not sleep with pets.  Day of Surgery  Do not apply any lotions/deodorants the morning of surgery.  Please wear clean clothes to the hospital/surgery center.  Please read over the following fact sheets that you were given. Pain Booklet, Coughing and Deep Breathing and Surgical Site Infection Prevention

## 2015-08-16 NOTE — Progress Notes (Addendum)
Anesthesia Chart Review:  Pt is 64 year old female scheduled for laparoscopic assisted ventral hernia repair with mesh on 08/23/2015 with Dr. Marlou Starks.   PCP is Dr. Billey Chang ([facility]). Nephrologist is Dr. Lorrene Reid. GI is Dr. Amedeo Plenty.  PMH includes:  cecal cancer, iron deficiency anemia due to chronic blood loss, endometrial cancer s/p hysterectomy, CKD stage III, HTN, murmur (minimal aortic valve sclerosis and trivial MR 01/2014 echo), HLD, Guillain-Barre syndrome in the mid-late 1980's, post-streptococcal glomerulonephritis, anemia, GERD, asthma, subclinical hypothyroidism, tonsillectomy. Never smoker. BMI 34. S/p laparoscopic assisted R colectomy 04/10/14  Pt dx 04/2015 with DVT. On coumadin for 3 months per Dr. Tamela Oddi notes, stopping 08/16/15.   She was evaluated by cardiologist Dr. Ellyn Hack in 01/2014 for palpitations, chest pain episode, and exertional dyspnea. See below for test results. She reports she had a three week period of not feeling well, being fatigues, fluttering, etc which lead her to her PCP who found she was anemic and referred her to GI and cardiology which ultimately lead to the finding of her colon cancer. She has had no further chest pain, significant palpitations, edema, or SOB with her usual activities. She remains on diuretic and anti-hypertensive therapy.  Medications include: lipitor, diltiazem, lasix, bystolic, coumadin.   Preoperative labs reviewed.    Treadmill nuclear stress test on 02/03/14 showed: Overall Impression: Low risk stress nuclear study with normal perfusion pattern. Probable "false positive" ECG stress test. (By notes, Dr. Ellyn Hack thought her stress EKG changes may go along more with LVH and diastolic dysfunction rather than true ischemia given the images were essentially normal. He felt her chest tightness in May 2015 may have been from elevated LV filling pressures that lead to increased wall stress and endocardial ischemia."  Echo on 02/03/14  showed: - Left ventricle: The cavity size was normal. Systolic function was normal. The estimated ejection fraction was in the range of 55% to 60%. Wall motion was normal; there were no regional wall motion abnormalities. There was a reduced contribution of atrial contraction to ventricular filling, due to increased ventricular diastolic pressure or atrial contractile dysfunction. Doppler parameters are consistent with a reversible restrictive pattern, indicative of decreased left ventricular diastolic compliance and/or increased left atrial pressure (grade 3 diastolic dysfunction). - Mitral valve: There was trivial regurgitation. - Left atrium: The atrium was moderately dilated. (Dr. Ellyn Hack felt the grade 3 diastolic dysfunction was "probably falsely elevated finding from her baseline because she just finished her stress test. However it does show that she has the ability to have reversible grade 3 diastolic function. HTN treatment and mild diuretic therapy was recommended.  48 hour Holter monitor in 01/2014 showed NSR with intermittent PVCs, no arrhythmia.   Willeen Cass, FNP-BC [facility] Short Stay Surgical Center/Anesthesiology Phone: [phone] 08/16/2015 1:24 PM

## 2015-08-17 ENCOUNTER — Encounter: Payer: Self-pay | Admitting: Genetic Counselor

## 2015-08-17 DIAGNOSIS — Z1379 Encounter for other screening for genetic and chromosomal anomalies: Secondary | ICD-10-CM | POA: Insufficient documentation

## 2015-08-20 ENCOUNTER — Other Ambulatory Visit (HOSPITAL_BASED_OUTPATIENT_CLINIC_OR_DEPARTMENT_OTHER): Payer: BLUE CROSS/BLUE SHIELD

## 2015-08-20 ENCOUNTER — Other Ambulatory Visit: Payer: Self-pay | Admitting: General Surgery

## 2015-08-20 DIAGNOSIS — C180 Malignant neoplasm of cecum: Secondary | ICD-10-CM | POA: Diagnosis not present

## 2015-08-21 LAB — CEA: CEA: 2.9 ng/mL (ref 0.0–5.0)

## 2015-08-22 MED ORDER — CHLORHEXIDINE GLUCONATE 4 % EX LIQD: 1.0000 "application " | Freq: Once | CUTANEOUS | Status: DC

## 2015-08-22 MED ORDER — CEFAZOLIN SODIUM-DEXTROSE 2-3 GM-% IV SOLR
2.0000 g | INTRAVENOUS | Status: AC
  Administered 2015-08-23: 2 g via INTRAVENOUS
  Filled 2015-08-22: qty 50

## 2015-08-23 ENCOUNTER — Inpatient Hospital Stay (HOSPITAL_COMMUNITY)
Admission: AD | Admit: 2015-08-23 | Discharge: 2015-08-26 | DRG: 355 | Disposition: A | Discharge status: Home / Self Care | Payer: BLUE CROSS/BLUE SHIELD | Source: Ambulatory Visit | Attending: General Surgery | Admitting: General Surgery

## 2015-08-23 ENCOUNTER — Encounter (HOSPITAL_COMMUNITY): Payer: Self-pay | Admitting: *Deleted

## 2015-08-23 ENCOUNTER — Ambulatory Visit (HOSPITAL_COMMUNITY): Payer: BLUE CROSS/BLUE SHIELD | Admitting: Certified Registered Nurse Anesthetist

## 2015-08-23 ENCOUNTER — Encounter (HOSPITAL_COMMUNITY)
Admission: AD | Disposition: A | Discharge status: Home / Self Care | Payer: Self-pay | Source: Ambulatory Visit | Attending: General Surgery

## 2015-08-23 ENCOUNTER — Ambulatory Visit (HOSPITAL_COMMUNITY): Payer: BLUE CROSS/BLUE SHIELD | Admitting: Vascular Surgery

## 2015-08-23 DIAGNOSIS — Z85038 Personal history of other malignant neoplasm of large intestine: Secondary | ICD-10-CM | POA: Diagnosis not present

## 2015-08-23 DIAGNOSIS — E669 Obesity, unspecified: Secondary | ICD-10-CM | POA: Diagnosis present

## 2015-08-23 DIAGNOSIS — Z6833 Body mass index (BMI) 33.0-33.9, adult: Secondary | ICD-10-CM

## 2015-08-23 DIAGNOSIS — I129 Hypertensive chronic kidney disease with stage 1 through stage 4 chronic kidney disease, or unspecified chronic kidney disease: Secondary | ICD-10-CM | POA: Diagnosis present

## 2015-08-23 DIAGNOSIS — Z7901 Long term (current) use of anticoagulants: Secondary | ICD-10-CM | POA: Diagnosis not present

## 2015-08-23 DIAGNOSIS — Z7982 Long term (current) use of aspirin: Secondary | ICD-10-CM | POA: Diagnosis not present

## 2015-08-23 DIAGNOSIS — Z9049 Acquired absence of other specified parts of digestive tract: Secondary | ICD-10-CM | POA: Diagnosis not present

## 2015-08-23 DIAGNOSIS — I739 Peripheral vascular disease, unspecified: Secondary | ICD-10-CM | POA: Diagnosis present

## 2015-08-23 DIAGNOSIS — D649 Anemia, unspecified: Secondary | ICD-10-CM | POA: Diagnosis present

## 2015-08-23 DIAGNOSIS — N183 Chronic kidney disease, stage 3 (moderate): Secondary | ICD-10-CM | POA: Diagnosis present

## 2015-08-23 DIAGNOSIS — K439 Ventral hernia without obstruction or gangrene: Secondary | ICD-10-CM | POA: Diagnosis present

## 2015-08-23 DIAGNOSIS — K219 Gastro-esophageal reflux disease without esophagitis: Secondary | ICD-10-CM | POA: Diagnosis present

## 2015-08-23 DIAGNOSIS — E039 Hypothyroidism, unspecified: Secondary | ICD-10-CM | POA: Diagnosis present

## 2015-08-23 HISTORY — PX: INSERTION OF MESH: SHX5868

## 2015-08-23 HISTORY — PX: VENTRAL HERNIA REPAIR: SHX424

## 2015-08-23 LAB — PROTIME-INR
INR: 1 (ref 0.00–1.49)
Prothrombin Time: 13.4 seconds (ref 11.6–15.2)

## 2015-08-23 SURGERY — REPAIR, HERNIA, VENTRAL, LAPAROSCOPIC
Anesthesia: General | Site: Abdomen

## 2015-08-23 MED ORDER — PROMETHAZINE HCL 25 MG/ML IJ SOLN
INTRAMUSCULAR | Status: AC
  Filled 2015-08-23: qty 1

## 2015-08-23 MED ORDER — LIDOCAINE HCL (CARDIAC) 20 MG/ML IV SOLN
INTRAVENOUS | Status: DC | PRN
  Administered 2015-08-23: 80 mg via INTRAVENOUS

## 2015-08-23 MED ORDER — GLYCOPYRROLATE 0.2 MG/ML IJ SOLN
INTRAMUSCULAR | Status: AC
  Filled 2015-08-23: qty 3

## 2015-08-23 MED ORDER — ROCURONIUM BROMIDE 100 MG/10ML IV SOLN
INTRAVENOUS | Status: DC | PRN
  Administered 2015-08-23: 5 mg via INTRAVENOUS
  Administered 2015-08-23 (×3): 10 mg via INTRAVENOUS
  Administered 2015-08-23: 40 mg via INTRAVENOUS
  Administered 2015-08-23: 10 mg via INTRAVENOUS

## 2015-08-23 MED ORDER — FENTANYL CITRATE (PF) 250 MCG/5ML IJ SOLN
INTRAMUSCULAR | Status: AC
  Filled 2015-08-23: qty 5

## 2015-08-23 MED ORDER — MORPHINE SULFATE (PF) 2 MG/ML IV SOLN
1.0000 mg | INTRAVENOUS | Status: DC | PRN
  Administered 2015-08-23 (×2): 2 mg via INTRAVENOUS
  Filled 2015-08-23: qty 2
  Filled 2015-08-23 (×2): qty 1

## 2015-08-23 MED ORDER — MEPERIDINE HCL 25 MG/ML IJ SOLN: 6.2500 mg | INTRAMUSCULAR | Status: DC | PRN

## 2015-08-23 MED ORDER — HYDROMORPHONE HCL 1 MG/ML IJ SOLN
INTRAMUSCULAR | Status: AC
  Administered 2015-08-23: 0.5 mg via INTRAVENOUS
  Filled 2015-08-23: qty 1

## 2015-08-23 MED ORDER — BUPIVACAINE-EPINEPHRINE 0.25% -1:200000 IJ SOLN
INTRAMUSCULAR | Status: DC | PRN
  Administered 2015-08-23: 30 mL

## 2015-08-23 MED ORDER — ATORVASTATIN CALCIUM 10 MG PO TABS
10.0000 mg | ORAL_TABLET | Freq: Every day | ORAL | Status: DC
  Administered 2015-08-23 – 2015-08-25 (×3): 10 mg via ORAL
  Filled 2015-08-23 (×3): qty 1

## 2015-08-23 MED ORDER — ONDANSETRON HCL 4 MG/2ML IJ SOLN: INTRAMUSCULAR | Status: DC | PRN

## 2015-08-23 MED ORDER — ONDANSETRON HCL 4 MG/2ML IJ SOLN
4.0000 mg | Freq: Four times a day (QID) | INTRAMUSCULAR | Status: DC | PRN
  Administered 2015-08-23: 4 mg via INTRAVENOUS
  Filled 2015-08-23 (×2): qty 2

## 2015-08-23 MED ORDER — DILTIAZEM HCL ER COATED BEADS 240 MG PO CP24
240.0000 mg | ORAL_CAPSULE | Freq: Every day | ORAL | Status: DC
  Administered 2015-08-24 – 2015-08-25 (×2): 240 mg via ORAL
  Filled 2015-08-23 (×2): qty 1

## 2015-08-23 MED ORDER — PANTOPRAZOLE SODIUM 40 MG IV SOLR
40.0000 mg | Freq: Every day | INTRAVENOUS | Status: DC
  Administered 2015-08-23 – 2015-08-25 (×3): 40 mg via INTRAVENOUS
  Filled 2015-08-23 (×3): qty 40

## 2015-08-23 MED ORDER — ROCURONIUM BROMIDE 50 MG/5ML IV SOLN
INTRAVENOUS | Status: AC
  Filled 2015-08-23: qty 2

## 2015-08-23 MED ORDER — MIDAZOLAM HCL 2 MG/2ML IJ SOLN
INTRAMUSCULAR | Status: AC
  Filled 2015-08-23: qty 2

## 2015-08-23 MED ORDER — NEBIVOLOL HCL 2.5 MG PO TABS
2.5000 mg | ORAL_TABLET | Freq: Every day | ORAL | Status: DC
  Filled 2015-08-23: qty 1

## 2015-08-23 MED ORDER — ACETAMINOPHEN 325 MG PO TABS
650.0000 mg | ORAL_TABLET | Freq: Four times a day (QID) | ORAL | Status: DC | PRN
  Administered 2015-08-24 – 2015-08-25 (×2): 650 mg via ORAL
  Filled 2015-08-23 (×3): qty 2

## 2015-08-23 MED ORDER — FENTANYL CITRATE (PF) 100 MCG/2ML IJ SOLN
INTRAMUSCULAR | Status: DC | PRN
  Administered 2015-08-23: 50 ug via INTRAVENOUS
  Administered 2015-08-23: 25 ug via INTRAVENOUS
  Administered 2015-08-23 (×3): 50 ug via INTRAVENOUS
  Administered 2015-08-23 (×5): 25 ug via INTRAVENOUS
  Administered 2015-08-23: 50 ug via INTRAVENOUS

## 2015-08-23 MED ORDER — METHOCARBAMOL 500 MG PO TABS
500.0000 mg | ORAL_TABLET | Freq: Four times a day (QID) | ORAL | Status: DC | PRN
  Administered 2015-08-25: 500 mg via ORAL
  Filled 2015-08-23: qty 1

## 2015-08-23 MED ORDER — MIDAZOLAM HCL 5 MG/5ML IJ SOLN
INTRAMUSCULAR | Status: DC | PRN
  Administered 2015-08-23: 2 mg via INTRAVENOUS

## 2015-08-23 MED ORDER — PROMETHAZINE HCL 25 MG/ML IJ SOLN
6.2500 mg | INTRAMUSCULAR | Status: DC | PRN
  Administered 2015-08-23: 6.25 mg via INTRAVENOUS

## 2015-08-23 MED ORDER — BUPIVACAINE-EPINEPHRINE (PF) 0.25% -1:200000 IJ SOLN
INTRAMUSCULAR | Status: AC
  Filled 2015-08-23: qty 30

## 2015-08-23 MED ORDER — KCL IN DEXTROSE-NACL 20-5-0.9 MEQ/L-%-% IV SOLN
INTRAVENOUS | Status: DC
  Administered 2015-08-23 – 2015-08-24 (×2): via INTRAVENOUS
  Filled 2015-08-23 (×5): qty 1000

## 2015-08-23 MED ORDER — HYDROMORPHONE HCL 1 MG/ML IJ SOLN
0.2500 mg | INTRAMUSCULAR | Status: DC | PRN
  Administered 2015-08-23 (×2): 0.5 mg via INTRAVENOUS

## 2015-08-23 MED ORDER — PROPOFOL 10 MG/ML IV BOLUS
INTRAVENOUS | Status: DC | PRN
  Administered 2015-08-23: 160 mg via INTRAVENOUS

## 2015-08-23 MED ORDER — SUCCINYLCHOLINE CHLORIDE 20 MG/ML IJ SOLN
INTRAMUSCULAR | Status: AC
  Filled 2015-08-23: qty 1

## 2015-08-23 MED ORDER — PROPOFOL 10 MG/ML IV BOLUS
INTRAVENOUS | Status: AC
  Filled 2015-08-23: qty 40

## 2015-08-23 MED ORDER — NEOSTIGMINE METHYLSULFATE 10 MG/10ML IV SOLN
INTRAVENOUS | Status: DC | PRN
  Administered 2015-08-23: 3 mg via INTRAVENOUS

## 2015-08-23 MED ORDER — GLYCOPYRROLATE 0.2 MG/ML IJ SOLN
INTRAMUSCULAR | Status: DC | PRN
  Administered 2015-08-23: .4 mg via INTRAVENOUS

## 2015-08-23 MED ORDER — HEPARIN SODIUM (PORCINE) 5000 UNIT/ML IJ SOLN
5000.0000 [IU] | Freq: Three times a day (TID) | INTRAMUSCULAR | Status: DC
  Administered 2015-08-24 – 2015-08-26 (×7): 5000 [IU] via SUBCUTANEOUS
  Filled 2015-08-23 (×7): qty 1

## 2015-08-23 MED ORDER — NEBIVOLOL HCL 2.5 MG PO TABS
2.5000 mg | ORAL_TABLET | Freq: Every day | ORAL | Status: DC
  Administered 2015-08-23 – 2015-08-25 (×3): 2.5 mg via ORAL
  Filled 2015-08-23 (×4): qty 1

## 2015-08-23 MED ORDER — ONDANSETRON HCL 4 MG/2ML IJ SOLN
INTRAMUSCULAR | Status: DC | PRN
  Administered 2015-08-23: 4 mg via INTRAVENOUS

## 2015-08-23 MED ORDER — ONDANSETRON 4 MG PO TBDP
4.0000 mg | ORAL_TABLET | Freq: Four times a day (QID) | ORAL | Status: DC | PRN
  Administered 2015-08-26: 4 mg via ORAL
  Filled 2015-08-23: qty 1

## 2015-08-23 MED ORDER — SODIUM CHLORIDE 0.9 % IR SOLN
Status: DC | PRN
Start: 2015-08-23 — End: 2015-08-23
  Administered 2015-08-23: 1000 mL

## 2015-08-23 MED ORDER — PHENYLEPHRINE HCL 10 MG/ML IJ SOLN
10.0000 mg | INTRAVENOUS | Status: DC | PRN
  Administered 2015-08-23: 5 ug/min via INTRAVENOUS

## 2015-08-23 MED ORDER — LIDOCAINE HCL (CARDIAC) 20 MG/ML IV SOLN
INTRAVENOUS | Status: AC
  Filled 2015-08-23: qty 5

## 2015-08-23 MED ORDER — LACTATED RINGERS IV SOLN
INTRAVENOUS | Status: DC | PRN
  Administered 2015-08-23 (×2): via INTRAVENOUS

## 2015-08-23 MED ORDER — FUROSEMIDE 40 MG PO TABS
40.0000 mg | ORAL_TABLET | Freq: Every day | ORAL | Status: DC
  Filled 2015-08-23 (×2): qty 1

## 2015-08-23 MED ORDER — ONDANSETRON HCL 4 MG/2ML IJ SOLN
INTRAMUSCULAR | Status: AC
  Filled 2015-08-23: qty 2

## 2015-08-23 SURGICAL SUPPLY — 60 items
APPLIER CLIP LOGIC TI 5 (MISCELLANEOUS) IMPLANT
APPLIER CLIP ROT 10 11.4 M/L (STAPLE)
BINDER ABD UNIV 12 45-62 (WOUND CARE) IMPLANT
BINDER ABDOMINAL 12 ML 46-62 (SOFTGOODS) ×3 IMPLANT
BINDER ABDOMINAL 46IN 62IN (WOUND CARE)
BNDG GAUZE ELAST 4 BULKY (GAUZE/BANDAGES/DRESSINGS) IMPLANT
CANISTER SUCTION 2500CC (MISCELLANEOUS) IMPLANT
CHLORAPREP W/TINT 26ML (MISCELLANEOUS) ×3 IMPLANT
CLIP APPLIE ROT 10 11.4 M/L (STAPLE) IMPLANT
COVER SURGICAL LIGHT HANDLE (MISCELLANEOUS) ×3 IMPLANT
DEVICE SECURE STRAP 25 ABSORB (INSTRUMENTS) ×6 IMPLANT
DEVICE TROCAR PUNCTURE CLOSURE (ENDOMECHANICALS) ×3 IMPLANT
DRAIN CHANNEL 19F RND (DRAIN) ×3 IMPLANT
DRAPE INCISE IOBAN 66X45 STRL (DRAPES) ×3 IMPLANT
DRAPE LAPAROSCOPIC ABDOMINAL (DRAPES) ×3 IMPLANT
ELECT CAUTERY BLADE 6.4 (BLADE) ×3 IMPLANT
ELECT REM PT RETURN 9FT ADLT (ELECTROSURGICAL) ×3
ELECTRODE REM PT RTRN 9FT ADLT (ELECTROSURGICAL) ×1 IMPLANT
EVACUATOR SILICONE 100CC (DRAIN) ×3 IMPLANT
GAUZE SPONGE 4X4 12PLY STRL (GAUZE/BANDAGES/DRESSINGS) ×3 IMPLANT
GAUZE SPONGE 4X4 16PLY XRAY LF (GAUZE/BANDAGES/DRESSINGS) ×3 IMPLANT
GLOVE BIO SURGEON STRL SZ7 (GLOVE) ×12 IMPLANT
GLOVE BIO SURGEON STRL SZ7.5 (GLOVE) ×9 IMPLANT
GLOVE BIOGEL PI IND STRL 7.0 (GLOVE) ×1 IMPLANT
GLOVE BIOGEL PI IND STRL 7.5 (GLOVE) ×2 IMPLANT
GLOVE BIOGEL PI IND STRL 8.5 (GLOVE) ×1 IMPLANT
GLOVE BIOGEL PI INDICATOR 7.0 (GLOVE) ×2
GLOVE BIOGEL PI INDICATOR 7.5 (GLOVE) ×4
GLOVE BIOGEL PI INDICATOR 8.5 (GLOVE) ×2
GLOVE SURG SS PI 7.0 STRL IVOR (GLOVE) ×6 IMPLANT
GOWN STRL REUS W/ TWL LRG LVL3 (GOWN DISPOSABLE) ×5 IMPLANT
GOWN STRL REUS W/TWL LRG LVL3 (GOWN DISPOSABLE) ×10
KIT BASIN OR (CUSTOM PROCEDURE TRAY) ×3 IMPLANT
KIT ROOM TURNOVER OR (KITS) ×3 IMPLANT
LIQUID BAND (GAUZE/BANDAGES/DRESSINGS) ×3 IMPLANT
MARKER SKIN DUAL TIP RULER LAB (MISCELLANEOUS) ×3 IMPLANT
MESH VENTRALIGHT ST 7X9N (Mesh General) ×3 IMPLANT
NEEDLE SPNL 22GX3.5 QUINCKE BK (NEEDLE) ×3 IMPLANT
NS IRRIG 1000ML POUR BTL (IV SOLUTION) ×3 IMPLANT
PAD ARMBOARD 7.5X6 YLW CONV (MISCELLANEOUS) ×6 IMPLANT
PENCIL BUTTON HOLSTER BLD 10FT (ELECTRODE) ×3 IMPLANT
SCALPEL HARMONIC ACE (MISCELLANEOUS) IMPLANT
SCISSORS LAP 5X35 DISP (ENDOMECHANICALS) IMPLANT
SET IRRIG TUBING LAPAROSCOPIC (IRRIGATION / IRRIGATOR) IMPLANT
SHEARS HARMONIC ACE PLUS 36CM (ENDOMECHANICALS) ×3 IMPLANT
SLEEVE ENDOPATH XCEL 5M (ENDOMECHANICALS) ×6 IMPLANT
SPONGE LAP 18X18 X RAY DECT (DISPOSABLE) ×3 IMPLANT
STAPLER VISISTAT 35W (STAPLE) ×3 IMPLANT
SUT MNCRL AB 4-0 PS2 18 (SUTURE) ×9 IMPLANT
SUT NOVA NAB DX-16 0-1 5-0 T12 (SUTURE) ×12 IMPLANT
SUT VIC AB 2-0 SH 27 (SUTURE) ×4
SUT VIC AB 2-0 SH 27XBRD (SUTURE) ×2 IMPLANT
TOWEL OR 17X24 6PK STRL BLUE (TOWEL DISPOSABLE) ×3 IMPLANT
TOWEL OR 17X26 10 PK STRL BLUE (TOWEL DISPOSABLE) ×3 IMPLANT
TRAY FOLEY CATH 16FR SILVER (SET/KITS/TRAYS/PACK) ×3 IMPLANT
TRAY LAPAROSCOPIC MC (CUSTOM PROCEDURE TRAY) ×3 IMPLANT
TROCAR XCEL BLUNT TIP 100MML (ENDOMECHANICALS) IMPLANT
TROCAR XCEL NON-BLD 11X100MML (ENDOMECHANICALS) ×3 IMPLANT
TROCAR XCEL NON-BLD 5MMX100MML (ENDOMECHANICALS) ×3 IMPLANT
TUBING INSUFFLATION (TUBING) ×3 IMPLANT

## 2015-08-23 NOTE — Anesthesia Procedure Notes (Signed)
Procedure Name: Intubation Date/Time: 08/23/2015 7:40 AM Performed by: Salli Quarry Angelamarie Avakian Pre-anesthesia Checklist: Patient identified, Emergency Drugs available, Suction available and Patient being monitored Patient Re-evaluated:Patient Re-evaluated prior to inductionOxygen Delivery Method: Circle system utilized Preoxygenation: Pre-oxygenation with 100% oxygen Intubation Type: IV induction Ventilation: Mask ventilation without difficulty Laryngoscope Size: Mac and 3 Grade View: Grade I Tube type: Oral Tube size: 7.5 mm Number of attempts: 1 Airway Equipment and Method: Stylet Placement Confirmation: ETT inserted through vocal cords under direct vision,  positive ETCO2 and breath sounds checked- equal and bilateral Secured at: 22 cm Tube secured with: Tape Dental Injury: Teeth and Oropharynx as per pre-operative assessment

## 2015-08-23 NOTE — Anesthesia Postprocedure Evaluation (Signed)
Anesthesia Post Note  Patient: [name]  Procedure(s) Performed: Procedure(s) (LRB): LAPAROSCOPIC ASSISTED VENTRAL HERNIA REPAIR WITH MESH (N/A) INSERTION OF MESH (N/A)  Patient location during evaluation: PACU Anesthesia Type: General Level of consciousness: awake and alert Pain management: pain level controlled Vital Signs Assessment: post-procedure vital signs reviewed and stable Respiratory status: spontaneous breathing, nonlabored ventilation, respiratory function stable and patient connected to nasal cannula oxygen Cardiovascular status: blood pressure returned to baseline and stable Postop Assessment: no signs of nausea or vomiting Anesthetic complications: no    Last Vitals:  Filed Vitals:   08/23/15 1257 08/23/15 1456  BP: 143/67 141/71  Pulse: 75 86  Temp: 36.7 C 36.8 C  Resp: 14 14    Last Pain:  Filed Vitals:   08/23/15 1458  PainSc: 8                  Benigna Delisi DAVID

## 2015-08-23 NOTE — H&P (Signed)
[name] 07/19/2015 11:25 AM Location: [facility] Patient #: [id number] DOB: [birth year] Single / Language: Cleophus Molt / Race: White Female   History of Present Illness Sammuel Hines. Marlou Starks MD; 07/19/2015 3:20 PM) The patient is a 64 year old female who presents for a follow-up for Abdominal pain. The patient is a 64 year old white female who is about a year and a half status post right colectomy for a T3 N0 colon cancer. She has developed a ventral hernia. She has had several episodes of pain associated with the hernia. She is scheduled for surgery to repair the hernia on December 8. She returns today with questions   Allergies Elbert Ewings, CMA; 07/19/2015 11:25 AM) [facility]  Medication History Elbert Ewings, CMA; 07/19/2015 11:26 AM) Warfarin Sodium (5MG  Tablet, Oral) Active. Iron (Ferrous Gluconate) (325MG  Tablet, Oral) Active. Aspirin EC Low Strength (81MG  Tablet DR, Oral) Active. Lipitor (10MG  Tablet, Oral) Active. Vitamin D (Cholecalciferol) (1000UNIT Tablet, Oral) Active. Lasix (40MG  Tablet, Oral) Active. Bystolic (2.5MG  Tablet, Oral) Active. Medications Reconciled    Review of Systems Eddie Dibbles S. Marlou Starks MD; 07/19/2015 3:20 PM) General Not Present- Appetite Loss, Chills, Fatigue, Fever, Night Sweats, Weight Gain and Weight Loss. Skin Not Present- Change in Wart/Mole, Dryness, Hives, Jaundice, New Lesions, Non-Healing Wounds, Rash and Ulcer. HEENT Not Present- Earache, Hearing Loss, Hoarseness, Nose Bleed, Oral Ulcers, Ringing in the Ears, Seasonal Allergies, Sinus Pain, Sore Throat, Visual Disturbances, Wears glasses/contact lenses and Yellow Eyes. Respiratory Not Present- Bloody sputum, Chronic Cough, Difficulty Breathing, Snoring and Wheezing. Breast Not Present- Breast Mass, Breast Pain, Nipple Discharge and Skin Changes. Cardiovascular Not Present- Chest Pain, Difficulty Breathing Lying Down, Leg Cramps, Palpitations, Rapid Heart Rate, Shortness of Breath  and Swelling of Extremities. Gastrointestinal Not Present- Abdominal Pain, Bloating, Bloody Stool, Change in Bowel Habits, Chronic diarrhea, Constipation, Difficulty Swallowing, Excessive gas, Gets full quickly at meals, Hemorrhoids, Indigestion, Nausea, Rectal Pain and Vomiting. Female Genitourinary Not Present- Frequency, Nocturia, Painful Urination, Pelvic Pain and Urgency. Musculoskeletal Not Present- Back Pain, Joint Pain, Joint Stiffness, Muscle Pain, Muscle Weakness and Swelling of Extremities. Neurological Not Present- Decreased Memory, Fainting, Headaches, Numbness, Seizures, Tingling, Tremor, Trouble walking and Weakness. Psychiatric Not Present- Anxiety, Bipolar, Change in Sleep Pattern, Depression, Fearful and Frequent crying. Endocrine Not Present- Cold Intolerance, Excessive Hunger, Hair Changes, Heat Intolerance, Hot flashes and New Diabetes. Hematology Not Present- Easy Bruising, Excessive bleeding, Gland problems, HIV and Persistent Infections.  Vitals Elbert Ewings CMA; 07/19/2015 11:26 AM) 07/19/2015 11:26 AM Weight: 203 lb Height: 65in Body Surface Area: 1.99 m Body Mass Index: 33.78 kg/m  Temp.: 98.57F(Temporal)  Pulse: 72 (Regular)  BP: 138/86 (Sitting, Left Arm, Standard)       Physical Exam Eddie Dibbles S. Marlou Starks MD; 07/19/2015 3:20 PM) General Mental Status-Alert. General Appearance-Consistent with stated age. Hydration-Well hydrated. Voice-Normal.  Head and Neck Head-normocephalic, atraumatic with no lesions or palpable masses. Trachea-midline. Thyroid Gland Characteristics - normal size and consistency.  Eye Eyeball - Bilateral-Extraocular movements intact. Sclera/Conjunctiva - Bilateral-No scleral icterus.  Chest and Lung Exam Chest and lung exam reveals -quiet, even and easy respiratory effort with no use of accessory muscles and on auscultation, normal breath sounds, no adventitious sounds and normal vocal  resonance. Inspection Chest Wall - Normal. Back - normal.  Cardiovascular Cardiovascular examination reveals -normal heart sounds, regular rate and rhythm with no murmurs and normal pedal pulses bilaterally.  Abdomen Note: The abdomen is soft. There is a moderate sized ventral hernia just above the umbilicus that reduces easily. There  is some discomfort with reduction of the hernia. There is no sign of obstruction.   Neurologic Neurologic evaluation reveals -alert and oriented x 3 with no impairment of recent or remote memory. Mental Status-Normal.  Musculoskeletal Normal Exam - Left-Upper Extremity Strength Normal and Lower Extremity Strength Normal. Normal Exam - Right-Upper Extremity Strength Normal and Lower Extremity Strength Normal.  Lymphatic Head & Neck  General Head & Neck Lymphatics: Bilateral - Description - Normal. Axillary  General Axillary Region: Bilateral - Description - Normal. Tenderness - Non Tender. Femoral & Inguinal  Generalized Femoral & Inguinal Lymphatics: Bilateral - Description - Normal. Tenderness - Non Tender.    Assessment & Plan Eddie Dibbles S. Marlou Starks MD; 07/19/2015 11:53 AM) VENTRAL INCISIONAL HERNIA WITHOUT OBSTRUCTION OR GANGRENE (K43.2) Impression: The patient has a known ventral hernia. She is scheduled for surgery on December 8. She has been having more discomfort associated with it but I see no evidence today of incarceration or strangulation. We spent time going over her questions today and we will plan for laparoscopic assisted ventral hernia repair with mesh on December 8    Signed by Luella Cook, MD (07/19/2015 3:21 PM)

## 2015-08-23 NOTE — Transfer of Care (Signed)
Immediate Anesthesia Transfer of Care Note  Patient: [name]  Procedure(s) Performed: Procedure(s): LAPAROSCOPIC ASSISTED VENTRAL HERNIA REPAIR WITH MESH (N/A) INSERTION OF MESH (N/A)  Patient Location: PACU  Anesthesia Type:General  Level of Consciousness: awake, alert , oriented and patient cooperative  Airway & Oxygen Therapy: Patient Spontanous Breathing and Patient connected to nasal cannula oxygen  Post-op Assessment: Report given to RN and Post -op Vital signs reviewed and stable  Post vital signs: Reviewed and stable  Last Vitals:  Filed Vitals:   08/23/15 0631 08/23/15 1047  BP: 151/58 121/59  Pulse: 72   Temp: 36.2 C   Resp: 20     Complications: No apparent anesthesia complications

## 2015-08-23 NOTE — Anesthesia Preprocedure Evaluation (Addendum)
Anesthesia Evaluation  Patient identified by MRN, date of birth, ID band Patient awake    Reviewed: Allergy & Precautions, NPO status , Patient's Chart, lab work & pertinent test results  History of Anesthesia Complications Negative for: history of anesthetic complications  Airway Mallampati: II  TM Distance: >3 FB Neck ROM: Full    Dental no notable dental hx. (+) Dental Advisory Given   Pulmonary asthma ,    Pulmonary exam normal breath sounds clear to auscultation       Cardiovascular hypertension, Pt. on medications and Pt. on home beta blockers + Peripheral Vascular Disease  Normal cardiovascular exam+ Valvular Problems/Murmurs ("aortic heart murmur")  Rhythm:Regular Rate:Normal     Neuro/Psych  Headaches,    GI/Hepatic GERD  ,  Endo/Other  Hypothyroidism   Renal/GU Renal disease (CKD, stage 3)     Musculoskeletal   Abdominal   Peds  Hematology  (+) anemia ,   Anesthesia Other Findings   Reproductive/Obstetrics                            Anesthesia Physical Anesthesia Plan  ASA: II  Anesthesia Plan: General   Post-op Pain Management:    Induction: Intravenous  Airway Management Planned: Oral ETT  Additional Equipment:   Intra-op Plan:   Post-operative Plan: Extubation in OR  Informed Consent: I have reviewed the patients History and Physical, chart, labs and discussed the procedure including the risks, benefits and alternatives for the proposed anesthesia with the patient or authorized representative who has indicated his/her understanding and acceptance.   Dental advisory given  Plan Discussed with: CRNA  Anesthesia Plan Comments:         Anesthesia Quick Evaluation                                  Anesthesia Evaluation  Patient identified by MRN, date of birth, ID band Patient awake    Reviewed: Allergy & Precautions, H&P , NPO status , Patient's Chart,  lab work & pertinent test results, reviewed documented beta blocker date and time   History of Anesthesia Complications Negative for: history of anesthetic complications  Airway Mallampati: I TM Distance: >3 FB Neck ROM: Full    Dental  (+) Teeth Intact, Dental Advisory Given   Pulmonary neg pulmonary ROS,  breath sounds clear to auscultation  Pulmonary exam normal       Cardiovascular hypertension, Pt. on medications and Pt. on home beta blockers - anginaRhythm:Regular Rate:Normal  Nm myoview ltd    02/03/2014       LOW RISK: Exercise 6:51 / 7 METS; + EKG for Ischemia, No CP; Images negative for ischemia or infarction   .  Transthoracic echocardiogram    02/03/2014       EF 55-60%. No regional WMA, Gr 3 DD (High filling pressures); Mod LA dilation   .  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    GI/Hepatic Neg liver ROS, GERD-  Controlled,Colon cancer   Endo/Other  Hypothyroidism (off meds now) Morbid obesity  Renal/GU Renal InsufficiencyRenal disease (creat 1.51)     Musculoskeletal   Abdominal (+) + obese,   Peds  Hematology  (+) Blood dyscrasia (Hb 9.7), anemia ,   Anesthesia Other  Findings   Reproductive/Obstetrics                        Anesthesia Physical Anesthesia Plan  ASA: III  Anesthesia Plan: General   Post-op Pain Management:    Induction: Intravenous  Airway Management Planned: Oral ETT  Additional Equipment:   Intra-op Plan:   Post-operative Plan: Extubation in OR  Informed Consent: I have reviewed the patients History and Physical, chart, labs and discussed the procedure including the risks, benefits and alternatives for the proposed anesthesia with the patient or authorized representative who has indicated his/her understanding and acceptance.   Dental advisory given  Plan Discussed with: CRNA and  Surgeon  Anesthesia Plan Comments: (Plan routine monitors, GETA)        Anesthesia Quick Evaluation

## 2015-08-23 NOTE — Op Note (Signed)
08/23/2015  10:23 AM  PATIENT:  [name]  64 y.o. female  PRE-OPERATIVE DIAGNOSIS:  VENTRAL HERNIA  POST-OPERATIVE DIAGNOSIS:  Ventral hernia  PROCEDURE:  Procedure(s): LAPAROSCOPIC ASSISTED VENTRAL HERNIA REPAIR WITH MESH (N/A) INSERTION OF MESH (N/A)  SURGEON:  Surgeon(s) and Role:    * Jovita Kussmaul, MD - Primary  PHYSICIAN ASSISTANT:   ASSISTANTS: Sharyn Dross, RNFA   ANESTHESIA:   general  EBL:  Total I/O In: 1000 [I.V.:1000] Out: 270 [Urine:250; Blood:20]  BLOOD ADMINISTERED:none  DRAINS: (1) Jackson-Pratt drain(s) with closed bulb suction in the subq   LOCAL MEDICATIONS USED:  MARCAINE     SPECIMEN:  No Specimen  DISPOSITION OF SPECIMEN:  N/A  COUNTS:  YES  TOURNIQUET:  * No tourniquets in log *  DICTATION: .Dragon Dictation   After informed consent was obtained the patient was brought to the operating room and placed in the supine position on the operating room table. After adequate induction of general anesthesia the patient's abdomen was prepped with ChloraPrep, allowed to dry, and draped in usual sterile manner. A site was chosen in the left upper quadrant for axis and the abdominal cavity. This area was infiltrated with quarter percent Marcaine. A small stab incision was made with a 15 blade knife. A 5 mm Optiview port was used to bluntly dissect through all the layers of the abdominal wall under direct vision until access was gained to the abdominal cavity. The abdomen was then insufflated with carbon dioxide without difficulty. The abdomen was generally inspected. The only abnormality noted was the ventral hernia. There was adhesions of omentum with transverse colon at the edge of the hernia. Another 5 mm port was placed in the left lower quadrant under direct vision. The adhesions were taken down sharply with the Harmonic scalpel. Care was taken to stay well away from the transverse colon. Once this was accomplished the entire abdominal wall was free  and clean. The falciform ligament was also taken down superiorly with the Harmonic scalpel. There were 2 adjacent hernias noted along the midline of the abdominal wall. A spinal needle was used to estimate the entire size of the 2 hernia defects. A 17 x 23 cm piece of ventral light mesh was chosen to adequately cover the 2 defects. The mesh was oriented with the coated side towards the bowel. 8 #1 Novafil stitches were placed at equidistant points around the edge of the mesh and the mesh was marked with lettering for orientation. Next an incision was made along the upper midline through the middle of the hernia defect with a 15 blade knife. The incision was carried through the skin and subcutaneous tissue sharply with electrocautery until the hernia sac was entered. The hernia sac was completely removed using the electrocautery. The mesh was then placed into the abdominal cavity with the appropriate orientation and the fascial defect was then closed with multiple figure-of-eight #1 Novafil stitches. The abdomen was then insufflated again. 8 small stab incisions were made at points corresponding to the tails of each stitch. A suture passer was used to bring the tails of each stitch through the appropriate opening in each of these stitches was then cinched down and tied. Once this was accomplished the mesh appeared to be in good apposition to the anterior abdominal wall. Another 5 mm port was placed on the right mid abdomen under direct vision. A secure strap absorbable tacker was then used to place absorbable tacks in between each of the stitches so  that there were no gaps. Once this was accomplished again the mesh was in good apposition to the anterior abdominal wall and everything appeared to be hemostatic. No other abnormalities were noted on general inspection of the abdominal cavity. At this point the gas was allowed to escape and the ports were removed. A 19 French round Blake drain was placed in the  subcutaneous tissue and brought out through the left lower quadrant port site. The drain was anchored to the skin with a 2-0 nylon stitch. The subcutaneous tissue of the midline incision was then closed with a running 2-0 Vicryl stitch. The skin was then closed with a running 4-0 Monocryl subcuticular stitch.All of the Other incisions were then closed with interrupted 4-0 Monocryl subcuticular stitches. Dermabond dressings were applied. The patient tolerated the procedure well. At the end of the case all needle sponge and instrument counts were correct. The patient was then awakened and taken to recovery in stable condition.  PLAN OF CARE: Admit to inpatient   PATIENT DISPOSITION:  PACU - hemodynamically stable.   Delay start of Pharmacological VTE agent (>24hrs) due to surgical blood loss or risk of bleeding: no

## 2015-08-23 NOTE — Interval H&P Note (Signed)
History and Physical Interval Note:  08/23/2015 7:21 AM  [name]  has presented today for surgery, with the diagnosis of VENTRAL HERNIA  The various methods of treatment have been discussed with the patient and family. After consideration of risks, benefits and other options for treatment, the patient has consented to  Procedure(s): [place] (N/A) INSERTION OF MESH (N/A) as a surgical intervention .  The patient's history has been reviewed, patient examined, no change in status, stable for surgery.  I have reviewed the patient's chart and labs.  Questions were answered to the patient's satisfaction.     [name],Harryette Shuart S

## 2015-08-24 ENCOUNTER — Encounter (HOSPITAL_COMMUNITY): Payer: Self-pay | Admitting: General Surgery

## 2015-08-24 MED ORDER — OXYCODONE-ACETAMINOPHEN 5-325 MG PO TABS
1.0000 | ORAL_TABLET | ORAL | Status: DC | PRN
  Administered 2015-08-24 – 2015-08-25 (×3): 2 via ORAL
  Filled 2015-08-24 (×3): qty 2

## 2015-08-24 NOTE — Progress Notes (Signed)
1 Day Post-Op  Subjective: Complains of soreness but otherwise seems ok  Objective: Vital signs in last 24 hours: Temp:  [97.8 F (36.6 C)-100 F (37.8 C)] 99.3 F (37.4 C) (12/09 0408) Pulse Rate:  [64-97] 95 (12/09 0408) Resp:  [10-19] 19 (12/09 0408) BP: (117-143)/(64-76) 140/76 mmHg (12/09 0408) SpO2:  [86 %-100 %] 93 % (12/09 0408) Weight:  [96.616 kg (213 lb)] 96.616 kg (213 lb) (12/08 1257) Last BM Date: 08/22/15  Intake/Output from previous day: 12/08 0701 - 12/09 0700 In: 3400 [P.O.:100; I.V.:3300] Out: 1480 [Urine:1400; Drains:60; Blood:20] Intake/Output this shift:    Resp: clear to auscultation bilaterally Cardio: regular rate and rhythm GI: soft, appropriately tender.  Lab Results:  No results for input(s): WBC, HGB, HCT, PLT in the last 72 hours. BMET No results for input(s): NA, K, CL, CO2, GLUCOSE, BUN, CREATININE, CALCIUM in the last 72 hours. PT/INR  Recent Labs  08/23/15 0628  LABPROT 13.4  INR 1.00   ABG No results for input(s): PHART, HCO3 in the last 72 hours.  Invalid input(s): PCO2, PO2  Studies/Results: No results found.  Anti-infectives: Anti-infectives    Start     Dose/Rate Route Frequency Ordered Stop   08/23/15 0700  ceFAZolin (ANCEF) IVPB 2 g/50 mL premix     2 g 100 mL/hr over 30 Minutes Intravenous To ShortStay Surgical 08/22/15 1342 08/23/15 0755      Assessment/Plan: s/p Procedure(s): LAPAROSCOPIC ASSISTED VENTRAL HERNIA REPAIR WITH MESH (N/A) INSERTION OF MESH (N/A) Advance diet  Add percocet once she can tolerate po's Ambulate D/c foley  LOS: 1 day    [name],Johntae Broxterman S 08/24/2015

## 2015-08-25 MED ORDER — POLYETHYLENE GLYCOL 3350 17 G PO PACK
17.0000 g | PACK | Freq: Every day | ORAL | Status: DC
  Administered 2015-08-25: 17 g via ORAL
  Filled 2015-08-25: qty 1

## 2015-08-25 MED ORDER — HYDROCODONE-ACETAMINOPHEN 10-325 MG PO TABS
0.5000 | ORAL_TABLET | ORAL | Status: DC | PRN
  Administered 2015-08-25 – 2015-08-26 (×2): 1 via ORAL
  Filled 2015-08-25: qty 1
  Filled 2015-08-25: qty 2
  Filled 2015-08-25 (×2): qty 1

## 2015-08-25 MED ORDER — DOCUSATE SODIUM 100 MG PO CAPS
100.0000 mg | ORAL_CAPSULE | Freq: Two times a day (BID) | ORAL | Status: DC
  Administered 2015-08-25 (×2): 100 mg via ORAL
  Filled 2015-08-25 (×2): qty 1

## 2015-08-25 NOTE — Progress Notes (Signed)
Patient ID: RIGLEY PREM, female   DOB: 1951-04-28, 64 y.o.   MRN: [id number]   LOS: 2 days   POD#2  Subjective: Doing better this am, tolerated clears. Pain controlled on oxycodone but would like to try something else as that makes her nauseated and sleepy. No flatus, N/V.   Objective: Vital signs in last 24 hours: Temp:  [98.1 F (36.7 C)-99.5 F (37.5 C)] 98.4 F (36.9 C) (12/10 0635) Pulse Rate:  [79-90] 83 (12/10 0635) Resp:  [16-18] 18 (12/10 0635) BP: (135-164)/(62-84) 135/62 mmHg (12/10 0635) SpO2:  [92 %-98 %] 92 % (12/10 0635) Last BM Date: 08/22/15   JP: 9ml/24h   Physical Exam General appearance: alert and no distress Resp: clear to auscultation bilaterally Cardio: regular rate and rhythm GI: Soft, diminished BS, incisions WNL, JP in place   Assessment/Plan: S/p LVH repair POD#2 -- Advance diet to fulls and advance as tolerated FEN -- Change to Norco for pain, SL IV VTE -- SCD's, SQH Dispo -- Likely home tomorrow   Lisette Abu, PA-C Pager: [phone] General Trauma PA Pager: [phone]  08/25/2015

## 2015-08-26 MED ORDER — HYDROCODONE-ACETAMINOPHEN 5-325 MG PO TABS: 1.0000 | ORAL_TABLET | ORAL | Status: DC | PRN

## 2015-08-26 MED ORDER — METHOCARBAMOL 500 MG PO TABS: 500.0000 mg | ORAL_TABLET | Freq: Four times a day (QID) | ORAL | Status: DC | PRN

## 2015-08-26 MED ORDER — ONDANSETRON 4 MG PO TBDP: 4.0000 mg | ORAL_TABLET | Freq: Four times a day (QID) | ORAL | Status: DC | PRN

## 2015-08-26 NOTE — Discharge Instructions (Signed)
Wash wounds daily in shower with soap and water. Do not soak. Apply antibiotic ointment (e.g. Neosporin) twice daily and as needed to keep moist. Cover with dry dressing.  Wear abdominal binder for comfort. You may decide not to wear it.  No driving while taking hydrocodone.

## 2015-08-26 NOTE — Progress Notes (Signed)
Patient ID: WHITTNI NARD, female   DOB: 23-Mar-1951, 64 y.o.   MRN: [id number]   LOS: 3 days   POD#3  Subjective: Some nausea this am but tolerated soft diet last night. Norco worked better for pain. +flatus but no BM. Ready to go home.   Objective: Vital signs in last 24 hours: Temp:  [97.3 F (36.3 C)-98.4 F (36.9 C)] 97.3 F (36.3 C) (12/11 0654) Pulse Rate:  [69-85] 69 (12/11 0654) Resp:  [18] 18 (12/11 0654) BP: (142-152)/(61-68) 142/68 mmHg (12/11 0654) SpO2:  [88 %-91 %] 91 % (12/11 0654) Last BM Date: 08/23/15   JP: 67ml/24h   Physical Exam General appearance: alert and no distress Resp: clear to auscultation bilaterally Cardio: regular rate and rhythm GI: Soft, +BS, incision C/D/I   Assessment/Plan: S/p LVH repair POD#3 Dispo -- D/C home    Lisette Abu, PA-C Pager: [phone] 08/26/2015

## 2015-08-26 NOTE — Progress Notes (Signed)
Pt discharged to home accomp by family.  JP chart given and explained and pt understands how to empty drain twice daily.  DC instructions reviewed and copy given.  Rx given and explained.  Other meds sent to pharmacy [facility] electronically.

## 2015-09-07 NOTE — Discharge Summary (Signed)
Physician Discharge Summary  Patient ID: [name] MRN: [id number] DOB/AGE: [birth year] 64 y.o.  Admit date: 08/23/2015 Discharge date: 09/07/2015  Admission Diagnoses:  Discharge Diagnoses:  Active Problems:   Ventral hernia without obstruction or gangrene   Discharged Condition: good  Hospital Course: the pt underwent a lap assisted ventral hernia repair with mesh. She tolerated the surgery well. It took a couple days to get her pain under control then she was ready for discharge home  Consults: None  Significant Diagnostic Studies: none  Treatments: surgery: as above  Discharge Exam: Blood pressure 142/68, pulse 69, temperature 97.3 F (36.3 C), temperature source Oral, resp. rate 18, height 5\' 5"  (1.651 m), weight 96.616 kg (213 lb), SpO2 91 %. Resp: clear to auscultation bilaterally Cardio: regular rate and rhythm GI: soft, mild tenderness. incisions look good  Disposition: 01-Home or Self Care     Medication List    STOP taking these medications        warfarin 5 MG tablet  Commonly known as:  COUMADIN      TAKE these medications        atorvastatin 10 MG tablet  Commonly known as:  LIPITOR  Take 10 mg by mouth at bedtime.     diltiazem 240 MG 24 hr capsule  Commonly known as:  CARDIZEM CD  Take 240 mg by mouth daily before breakfast.     furosemide 40 MG tablet  Commonly known as:  LASIX  Take 40 mg by mouth daily before breakfast.     HYDROcodone-acetaminophen 5-325 MG tablet  Commonly known as:  NORCO  Take 1-2 tablets by mouth every 4 (four) hours as needed (Pain).     methocarbamol 500 MG tablet  Commonly known as:  ROBAXIN  Take 1 tablet (500 mg total) by mouth every 6 (six) hours as needed for muscle spasms.     multivitamin capsule  Take 1 capsule by mouth daily.     nebivolol 2.5 MG tablet  Commonly known as:  BYSTOLIC  Take 2.5 mg by mouth daily.     ondansetron 4 MG disintegrating tablet  Commonly known as:  ZOFRAN-ODT   Take 1 tablet (4 mg total) by mouth every 6 (six) hours as needed for nausea.     traMADol 50 MG tablet  Commonly known as:  ULTRAM  take 50-100 mg by mouth three times a day if needed for pain           Follow-up Information    Schedule an appointment as soon as possible for a visit with Merrie Roof, MD.   Specialty:  General Surgery   Contact information:   [address] [place] [address] [phone]       Signed: Merrie Roof 09/07/2015, 1:59 PM

## 2015-11-13 ENCOUNTER — Other Ambulatory Visit: Payer: BLUE CROSS/BLUE SHIELD

## 2015-11-13 ENCOUNTER — Ambulatory Visit: Payer: BLUE CROSS/BLUE SHIELD | Admitting: Oncology

## 2015-11-19 ENCOUNTER — Encounter: Payer: Self-pay | Admitting: Oncology

## 2016-01-30 ENCOUNTER — Telehealth: Payer: Self-pay | Admitting: *Deleted

## 2016-01-30 ENCOUNTER — Telehealth: Payer: Self-pay | Admitting: Oncology

## 2016-01-30 ENCOUNTER — Other Ambulatory Visit: Payer: Self-pay | Admitting: *Deleted

## 2016-01-30 NOTE — Telephone Encounter (Signed)
Message from pt to reschedule missed office visit. Order to schedulers to contact pt with appt.

## 2016-01-30 NOTE — Telephone Encounter (Signed)
pt cld & left a voicemail needing appt w/Dr Sherrill-cld pt back and gave appt time & date for 6/20@9 :30

## 2016-02-26 ENCOUNTER — Other Ambulatory Visit: Payer: Self-pay | Admitting: *Deleted

## 2016-02-26 ENCOUNTER — Encounter: Payer: Self-pay | Admitting: Oncology

## 2016-02-26 DIAGNOSIS — C180 Malignant neoplasm of cecum: Secondary | ICD-10-CM

## 2016-03-04 ENCOUNTER — Other Ambulatory Visit (HOSPITAL_BASED_OUTPATIENT_CLINIC_OR_DEPARTMENT_OTHER): Payer: BLUE CROSS/BLUE SHIELD

## 2016-03-04 ENCOUNTER — Telehealth: Payer: Self-pay | Admitting: Oncology

## 2016-03-04 ENCOUNTER — Ambulatory Visit (HOSPITAL_BASED_OUTPATIENT_CLINIC_OR_DEPARTMENT_OTHER): Payer: BLUE CROSS/BLUE SHIELD | Admitting: Oncology

## 2016-03-04 VITALS — BP 155/66 | HR 64 | Temp 97.7°F | Resp 17 | Ht 65.0 in | Wt 203.6 lb

## 2016-03-04 DIAGNOSIS — Z8541 Personal history of malignant neoplasm of cervix uteri: Secondary | ICD-10-CM | POA: Diagnosis not present

## 2016-03-04 DIAGNOSIS — Z86718 Personal history of other venous thrombosis and embolism: Secondary | ICD-10-CM

## 2016-03-04 DIAGNOSIS — C180 Malignant neoplasm of cecum: Secondary | ICD-10-CM

## 2016-03-04 DIAGNOSIS — Z862 Personal history of diseases of the blood and blood-forming organs and certain disorders involving the immune mechanism: Secondary | ICD-10-CM

## 2016-03-04 DIAGNOSIS — Z85038 Personal history of other malignant neoplasm of large intestine: Secondary | ICD-10-CM | POA: Diagnosis not present

## 2016-03-04 LAB — BASIC METABOLIC PANEL
ANION GAP: 9 meq/L (ref 3–11)
BUN: 24 mg/dL (ref 7.0–26.0)
CALCIUM: 9.9 mg/dL (ref 8.4–10.4)
CO2: 22 meq/L (ref 22–29)
Chloride: 109 mEq/L (ref 98–109)
Creatinine: 1.4 mg/dL — ABNORMAL HIGH (ref 0.6–1.1)
EGFR: 40 mL/min/{1.73_m2} — ABNORMAL LOW (ref >90)
GLUCOSE: 99 mg/dL (ref 70–140)
POTASSIUM: 4.4 meq/L (ref 3.5–5.1)
Sodium: 140 mEq/L (ref 136–145)

## 2016-03-04 NOTE — Telephone Encounter (Signed)
cld pt and left message of time 7 date of appt on 12/21@9 :30

## 2016-03-04 NOTE — Progress Notes (Signed)
  [place] OFFICE PROGRESS NOTE   Diagnosis: colon cancer  INTERVAL HISTORY:   [name] returns as scheduled. She reports marked improvement in the right hip discomfort since undergoing hip replacement surgery. She underwent repair of the ventral hernia on 08/23/2015. She completed 3 months of anticoagulation therapy. No recurrent pain in the right leg. She notes mild swelling of the right lower leg intermittently.  She reports nausea some mornings that resolved spontaneously over a few hours. No vomiting. No difficulty with bowel or bladder function.  Objective:  Vital signs in last 24 hours:  Blood pressure 155/66, pulse 64, temperature 97.7 F (36.5 C), temperature source Oral, resp. rate 17, height '5\' 5"'$  (1.651 m), weight 203 lb 9.6 oz (92.352 kg), SpO2 99 %.    HEENT: Neck without mass Lymphatics: No cervical, supraclavicular, axillary, or inguinal nodes Resp: Lungs clear bilaterally Cardio: Regular rate and rhythm GI: No hepatomegaly, no mass, nontender Vascular: The right lower leg is slightly larger than the left side. No erythema or edema   Lab Results: CEA pending  Medications: I have reviewed the patient's current medications.  Assessment/Plan: 1. 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 History of uterine cancer-2006  3. history of glomerulonephritis with chronic renal insufficiency  4. family history of multiple cancers including colon cancer  5. 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 Hypertension  7. History of anemia-microcytic, likely secondary to colon cancer  8. Abdominal incisional hernia, hernia repair 08/23/2015  9. 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 We will follow-up on the CEA from today. She will return for an office visit and CEA in 6 months. She continues screening follow-up with Dr. Ardis Hughs given the clinical history consistent with where syndrome.  Betsy Coder, MD  03/04/2016  10:10 AM

## 2016-03-05 ENCOUNTER — Telehealth: Payer: Self-pay | Admitting: *Deleted

## 2016-03-05 LAB — CEA (PARALLEL TESTING): CEA: 2.4 ng/mL

## 2016-03-05 NOTE — Telephone Encounter (Signed)
-----   Message from [name], MD sent at 03/05/2016  4:53 PM EDT ----- Please call patient, cea is normal

## 2016-03-05 NOTE — Telephone Encounter (Signed)
Per Dr. Benay Spice, patient notified that CEA is normal.  Pt appreciative of call and has no questions at this time.

## 2016-04-02 ENCOUNTER — Encounter: Payer: Self-pay | Admitting: Gastroenterology

## 2016-05-14 ENCOUNTER — Ambulatory Visit (AMBULATORY_SURGERY_CENTER): Payer: Self-pay

## 2016-05-14 VITALS — Ht 65.5 in | Wt 205.6 lb

## 2016-05-14 DIAGNOSIS — C180 Malignant neoplasm of cecum: Secondary | ICD-10-CM

## 2016-05-14 MED ORDER — SUPREP BOWEL PREP KIT 17.5-3.13-1.6 GM/177ML PO SOLN: 1.0000 | Freq: Once | ORAL | 0 refills | Status: AC

## 2016-05-30 ENCOUNTER — Encounter: Payer: Self-pay | Admitting: Gastroenterology

## 2016-05-30 ENCOUNTER — Ambulatory Visit (AMBULATORY_SURGERY_CENTER): Payer: BLUE CROSS/BLUE SHIELD | Admitting: Gastroenterology

## 2016-05-30 VITALS — BP 127/61 | HR 60 | Temp 98.5°F | Resp 16 | Ht 65.5 in | Wt 205.0 lb

## 2016-05-30 DIAGNOSIS — Z85038 Personal history of other malignant neoplasm of large intestine: Secondary | ICD-10-CM | POA: Diagnosis not present

## 2016-05-30 DIAGNOSIS — C180 Malignant neoplasm of cecum: Secondary | ICD-10-CM

## 2016-05-30 DIAGNOSIS — Z1509 Genetic susceptibility to other malignant neoplasm: Secondary | ICD-10-CM | POA: Diagnosis not present

## 2016-05-30 MED ORDER — SODIUM CHLORIDE 0.9 % IV SOLN: 500.0000 mL | INTRAVENOUS | Status: DC

## 2016-05-30 NOTE — Progress Notes (Signed)
Report to PACU, RN, vss, BBS= Clear.  

## 2016-05-30 NOTE — Patient Instructions (Signed)
Discharge instructions given. Normal exam. Resume previous medications. YOU HAD AN ENDOSCOPIC PROCEDURE TODAY AT THE [facility]:   Refer to the procedure report that was given to you for any specific questions about what was found during the examination.  If the procedure report does not answer your questions, please call your gastroenterologist to clarify.  If you requested that your care partner not be given the details of your procedure findings, then the procedure report has been included in a sealed envelope for you to review at your convenience later.  YOU SHOULD EXPECT: Some feelings of bloating in the abdomen. Passage of more gas than usual.  Walking can help get rid of the air that was put into your GI tract during the procedure and reduce the bloating. If you had a lower endoscopy (such as a colonoscopy or flexible sigmoidoscopy) you may notice spotting of blood in your stool or on the toilet paper. If you underwent a bowel prep for your procedure, you may not have a normal bowel movement for a few days.  Please Note:  You might notice some irritation and congestion in your nose or some drainage.  This is from the oxygen used during your procedure.  There is no need for concern and it should clear up in a day or so.  SYMPTOMS TO REPORT IMMEDIATELY:   Following lower endoscopy (colonoscopy or flexible sigmoidoscopy):  Excessive amounts of blood in the stool  Significant tenderness or worsening of abdominal pains  Swelling of the abdomen that is new, acute  Fever of 100F or higher   For urgent or emergent issues, a gastroenterologist can be reached at any hour by calling [phone].   DIET:  We do recommend a small meal at first, but then you may proceed to your regular diet.  Drink plenty of fluids but you should avoid alcoholic beverages for 24 hours.  ACTIVITY:  You should plan to take it easy for the rest of today and you should NOT DRIVE or use heavy machinery  until tomorrow (because of the sedation medicines used during the test).    FOLLOW UP: Our staff will call the number listed on your records the next business day following your procedure to check on you and address any questions or concerns that you may have regarding the information given to you following your procedure. If we do not reach you, we will leave a message.  However, if you are feeling well and you are not experiencing any problems, there is no need to return our call.  We will assume that you have returned to your regular daily activities without incident.  If any biopsies were taken you will be contacted by phone or by letter within the next 1-3 weeks.  Please call us at [phone] if you have not heard about the biopsies in 3 weeks.    SIGNATURES/CONFIDENTIALITY: You and/or your care partner have signed paperwork which will be entered into your electronic medical record.  These signatures attest to the fact that that the information above on your After Visit Summary has been reviewed and is understood.  Full responsibility of the confidentiality of this discharge information lies with you and/or your care-partner. 

## 2016-05-30 NOTE — Op Note (Signed)
[name]: [name] Procedure Date: 05/30/2016 2:16 PM MRN: [id number] Endoscopist: [name] , MD Age: 65 Referring MD:  Date of Birth: 10-Dec-1950 Gender: Female Account #: [id number] Procedure:                Colonoscopy Indications:              High risk colon cancer surveillance: Personal                            history of colon cancer; 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 Marlou Colon.                            04/10/2014; Microsatellite instability-high, loss                            of MLH1 and PMS2 expression; Negative mutation                            testing of the MLH1 gene. Cancer was discovered by                            colonoscopy Dr. Teena Colon 02/2014. She has had 4                            family members with colon cancer including sister                            at 68, father in 72s. Also a cousin. Colonoscopy                            04/2015 Dr. Ardis Colon found one small HP. Medicines:                Monitored Anesthesia Care Procedure:                Pre-Anesthesia Assessment:                           - Prior to the procedure, a History and Physical                            was performed, and patient medications and                            allergies were reviewed. The patient's tolerance of                            previous anesthesia was also reviewed. The risks  and benefits of the procedure and the sedation                            options and risks were discussed with the patient.                            All questions were answered, and informed consent                            was obtained. Prior Anticoagulants: The patient has                            taken no previous anticoagulant or antiplatelet                            agents.  ASA Grade Assessment: II - A patient with                            mild systemic disease. After reviewing the risks                            and benefits, the patient was deemed in                            satisfactory condition to undergo the procedure.                           After obtaining informed consent, the colonoscope                            was passed under direct vision. Throughout the                            procedure, the patient's blood pressure, pulse, and                            oxygen saturations were monitored continuously. The                            Model CF-HQ190L (SN#[id number]) scope was introduced                            through the anus and advanced to the the                            ileocolonic anastomosis. The colonoscopy was                            performed without difficulty. The patient tolerated                            the procedure well. The quality of the bowel                              preparation was good. The rectum was photographed. Scope In: 2:21:54 PM Scope Out: 2:30:34 PM Scope Withdrawal Time: 0 hours 5 minutes 7 seconds  Total Procedure Duration: 0 hours 8 minutes 40 seconds  Findings:                 There was evidence of a prior end-to-end                            ileo-colonic anastomosis (right hemicolectomy).                            This was patent.                           The exam was otherwise without abnormality on                            direct and retroflexion views. Complications:            No immediate complications. Estimated blood loss:                            None. Estimated Blood Loss:     Estimated blood loss: none. Impression:               - Patent end-to-end ileo-colonic anastomosis (right                            hemicolectomy 2015)                           - The examination was otherwise normal on direct                            and retroflexion views.                            - No polyps or cancers Recommendation:           - Patient has a contact number available for                            emergencies. The signs and symptoms of potential                            delayed complications were discussed with the                            patient. Return to normal activities tomorrow.                            Written discharge instructions were provided to the                            patient.                           - Resume previous diet.                           -  Continue present medications.                           - Repeat colonoscopy in 1 year for screening                            purposes. Erin P Jacobs, MD 05/30/2016 2:35:22 PM This report has been signed electronically. 

## 2016-06-02 ENCOUNTER — Telehealth: Payer: Self-pay | Admitting: *Deleted

## 2016-06-02 NOTE — Telephone Encounter (Signed)
  Follow up Call-  Call back number 05/30/2016 05/04/2015  Post procedure Call Back phone  # [phone] cell [phone]  Permission to leave phone message Yes Yes  Some recent data might be hidden     Patient questions:  Message left to call us if necessary.

## 2016-06-23 ENCOUNTER — Other Ambulatory Visit: Payer: Self-pay | Admitting: Family Medicine

## 2016-06-23 DIAGNOSIS — Z1231 Encounter for screening mammogram for malignant neoplasm of breast: Secondary | ICD-10-CM

## 2016-06-25 DIAGNOSIS — Z96641 Presence of right artificial hip joint: Secondary | ICD-10-CM | POA: Insufficient documentation

## 2016-08-01 ENCOUNTER — Ambulatory Visit
Admission: RE | Admit: 2016-08-01 | Discharge: 2016-08-01 | Disposition: A | Discharge status: Home / Self Care | Payer: BLUE CROSS/BLUE SHIELD | Source: Ambulatory Visit | Attending: Family Medicine | Admitting: Family Medicine

## 2016-08-01 DIAGNOSIS — Z1231 Encounter for screening mammogram for malignant neoplasm of breast: Secondary | ICD-10-CM

## 2016-08-17 ENCOUNTER — Telehealth: Payer: Self-pay | Admitting: Oncology

## 2016-08-17 NOTE — Telephone Encounter (Signed)
Lvm advising appt 12/21 moved to 09/22/16 @ 9.30 due to md pal.

## 2016-08-26 ENCOUNTER — Telehealth: Payer: Self-pay | Admitting: *Deleted

## 2016-08-26 NOTE — Telephone Encounter (Signed)
Message from pt requesting to have labs drawn by nephrology to avoid 2 lab appts within weeks of each other. Noted pt is to have CEA drawn.  Best to have done at the same lab for comparison purposes. Pt also requesting to move office visit to Dec. No availability on MD schedule for December but will call pt if appointment becomes available. Left message informing her of the above.

## 2016-09-04 ENCOUNTER — Other Ambulatory Visit: Payer: BLUE CROSS/BLUE SHIELD

## 2016-09-04 ENCOUNTER — Ambulatory Visit: Payer: BLUE CROSS/BLUE SHIELD | Admitting: Oncology

## 2016-09-22 ENCOUNTER — Ambulatory Visit (HOSPITAL_BASED_OUTPATIENT_CLINIC_OR_DEPARTMENT_OTHER): Payer: BLUE CROSS/BLUE SHIELD | Admitting: Oncology

## 2016-09-22 ENCOUNTER — Telehealth: Payer: Self-pay | Admitting: Oncology

## 2016-09-22 ENCOUNTER — Other Ambulatory Visit (HOSPITAL_BASED_OUTPATIENT_CLINIC_OR_DEPARTMENT_OTHER): Payer: BLUE CROSS/BLUE SHIELD

## 2016-09-22 VITALS — BP 148/71 | HR 63 | Temp 98.7°F | Resp 18 | Ht 65.5 in | Wt 205.0 lb

## 2016-09-22 DIAGNOSIS — C180 Malignant neoplasm of cecum: Secondary | ICD-10-CM

## 2016-09-22 DIAGNOSIS — Z86718 Personal history of other venous thrombosis and embolism: Secondary | ICD-10-CM

## 2016-09-22 DIAGNOSIS — Z85048 Personal history of other malignant neoplasm of rectum, rectosigmoid junction, and anus: Secondary | ICD-10-CM | POA: Diagnosis not present

## 2016-09-22 LAB — CEA (IN HOUSE-CHCC): CEA (CHCC-IN HOUSE): 3.76 ng/mL (ref 0.00–5.00)

## 2016-09-22 NOTE — Telephone Encounter (Signed)
Left message re July lab/fu. Schedule mailed.

## 2016-09-22 NOTE — Progress Notes (Signed)
  [name] OFFICE PROGRESS NOTE   Diagnosis: Colon cancer  INTERVAL HISTORY:   [name] returns as scheduled. She feels well. No difficulty with bowel function. Good appetite. She underwent a colonoscopy by Dr. Ardis Hughs in September and no polyps were found.  Objective:  Vital signs in last 24 hours:  Blood pressure (!) 148/71, pulse 63, temperature 98.7 F (37.1 C), temperature source Oral, resp. rate 18, height 5' 5.5" (1.664 m), weight 205 lb (93 kg), SpO2 100 %.    HEENT: Neck without mass Lymphatics: No cervical, supraclavicular, axillary, or inguinal nodes Resp: Lungs clear bilaterally Cardio: Regular rate and rhythm GI: No hepatomegaly, no mass, nontender Vascular: No leg edema    Lab Results:  CEA-pending   Medications: I have reviewed the patient's current medications.  Assessment/Plan: 1. 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 Microsatellite instability-high, loss of MLH1 and PMS2 expression ? Negative mutation testing of the MLH1 gene   2. History of uterine cancer-2006  3. history of glomerulonephritis with chronic renal insufficiency  4. family history of multiple cancers including colon cancer  5. 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 Hypertension  7. History of anemia-microcytic, likely secondary to colon cancer  8. Abdominal incisional hernia, hernia repair 08/23/2015  9. Right leg swelling 05/15/2015-Doppler confirmed a chronic DVT of the right popliteal vein and an indeterminate of the posterior tibial veins , status post 3 months of Xarelto    Disposition:  [name] remains in clinical remission from colon cancer. She will continue endoscopic surveillance with Dr. Ardis Hughs. She declined additional molecular testing to evaluate the loss of MLH1 and PMS2. Her personal and family history are consistent with a diagnosis of hereditary  non-polyposis colon cancer syndrome.  We will follow-up on the CEA from today. She will return for an office visit and CEA in 6 months.  15 minutes were spent with patient today.  Betsy Coder, MD  09/22/2016  10:56 AM

## 2016-09-23 ENCOUNTER — Telehealth: Payer: Self-pay

## 2016-09-23 LAB — CEA (PARALLEL TESTING): CEA: 2.4 ng/mL

## 2016-09-23 NOTE — Telephone Encounter (Signed)
-----   Message from [name], MD sent at 09/23/2016  1:54 PM EST ----- Please call patient, cea is normal

## 2016-09-23 NOTE — Telephone Encounter (Signed)
Called and informed pt of normal cea results. Pt verbalized understanding.

## 2017-03-26 ENCOUNTER — Ambulatory Visit (HOSPITAL_BASED_OUTPATIENT_CLINIC_OR_DEPARTMENT_OTHER): Payer: BLUE CROSS/BLUE SHIELD | Admitting: Oncology

## 2017-03-26 ENCOUNTER — Other Ambulatory Visit (HOSPITAL_BASED_OUTPATIENT_CLINIC_OR_DEPARTMENT_OTHER): Payer: BLUE CROSS/BLUE SHIELD

## 2017-03-26 VITALS — BP 147/73 | HR 77 | Temp 98.5°F | Resp 18 | Ht 65.5 in | Wt 210.2 lb

## 2017-03-26 DIAGNOSIS — Z85038 Personal history of other malignant neoplasm of large intestine: Secondary | ICD-10-CM

## 2017-03-26 DIAGNOSIS — C180 Malignant neoplasm of cecum: Secondary | ICD-10-CM

## 2017-03-26 DIAGNOSIS — Z86718 Personal history of other venous thrombosis and embolism: Secondary | ICD-10-CM

## 2017-03-26 LAB — CEA (IN HOUSE-CHCC): CEA (CHCC-In House): 3.68 ng/mL (ref 0.00–5.00)

## 2017-03-26 NOTE — Progress Notes (Signed)
  [place] OFFICE PROGRESS NOTE   Diagnosis: Colon cancer  INTERVAL HISTORY:   [name] returns as scheduled. She had recent cataract surgery. Hip pain has improved following hip replacement surgery. She had a Doppler of the right leg on 03/10/2017 for evaluation of persistent right leg swelling. There was no deep vein thrombosis.   Objective:  Vital signs in last 24 hours:  Blood pressure (!) 147/73, pulse 77, temperature 98.5 F (36.9 C), temperature source Oral, resp. rate 18, height 5' 5.5" (1.664 m), weight 210 lb 3.2 oz (95.3 kg), SpO2 98 %.    HEENT: Neck without mass Lymphatics: No cervical, supraclavicular, axillary, or inguinal nodes Resp: Lungs clear bilaterally Cardio: Regular rate and rhythm GI: No hepatomegaly, no mass, nontender Vascular: Trace edema at the right lower leg   Lab Results  Component Value Date   CEA1 3.76 09/22/2016     Medications: I have reviewed the patient's current medications.  Assessment/Plan: 1. 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 Microsatellite instability-high, loss of MLH1 and PMS2 expression ? Negative mutation testing of the MLH1 gene   2. History of uterine cancer-2006  3. history of glomerulonephritis with chronic renal insufficiency  4. family history of multiple cancers including colon cancer  5. 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 Hypertension  7. History of anemia-microcytic, likely secondary to colon cancer  8. Abdominal incisional hernia, hernia repair 08/23/2015  9. Right leg swelling 05/15/2015-Doppler confirmed a chronic DVT of the right popliteal vein and an indeterminate of the posterior tibial veins , status post 3 months of Xarelto. Negative right lower extremity Doppler at no for health 03/10/2017    Disposition:  [name] remains in clinical remission from colon cancer. She will  continue endoscopic surveillance with Dr. Ardis Hughs. She will return for an office visit and CEA in one year.  15 minutes were spent with the patient today. The majority of the time was used for counseling and coordination of care.  Erin Romberg, MD  03/26/2017  12:56 PM

## 2017-03-27 ENCOUNTER — Telehealth: Payer: Self-pay | Admitting: Oncology

## 2017-03-27 LAB — CEA (PARALLEL TESTING): CEA: 2.7 ng/mL — AB

## 2017-03-27 NOTE — Telephone Encounter (Signed)
Mailed appt for 09/28/17.

## 2017-03-30 ENCOUNTER — Telehealth: Payer: Self-pay | Admitting: *Deleted

## 2017-03-30 NOTE — Telephone Encounter (Signed)
Pt notified of normal CEA results. She will follow up in one year with lab.

## 2017-03-30 NOTE — Telephone Encounter (Signed)
-----   Message from [name], MD sent at 03/30/2017 12:04 AM EDT ----- Please call patient,cea is normal

## 2017-04-06 ENCOUNTER — Telehealth: Payer: Self-pay

## 2017-04-06 ENCOUNTER — Encounter: Payer: Self-pay | Admitting: Oncology

## 2017-04-06 NOTE — Telephone Encounter (Signed)
Pt is asking if Dr Benay Spice has recommendation for an internist, she needs a new one.

## 2017-04-13 ENCOUNTER — Telehealth: Payer: Self-pay | Admitting: Internal Medicine

## 2017-04-13 NOTE — Telephone Encounter (Signed)
Pt called asking if you would take her on as a new patient. She was referred to you by her oncologist, Dr Betsy Coder. Would you see her to establish care?

## 2017-04-13 NOTE — Telephone Encounter (Signed)
Yes, I will see her 

## 2017-04-14 NOTE — Telephone Encounter (Signed)
Appointment scheduled.

## 2017-06-08 LAB — BASIC METABOLIC PANEL
BUN: 34 — AB (ref 4–21)
CREATININE: 1.5 — AB (ref 0.5–1.1)
GLUCOSE: 103
POTASSIUM: 4.4 (ref 3.4–5.3)
Sodium: 139 (ref 137–147)

## 2017-06-15 LAB — BASIC METABOLIC PANEL
BUN: 35 — AB (ref 4–21)
Creatinine: 1.5 — AB (ref 0.5–1.1)
Glucose: 103
Potassium: 4.4 (ref 3.4–5.3)
SODIUM: 139 (ref 137–147)

## 2017-06-15 LAB — HM HEPATITIS C SCREENING LAB: HM HEPATITIS C SCREENING: NEGATIVE

## 2017-06-22 ENCOUNTER — Encounter: Payer: Self-pay | Admitting: Gastroenterology

## 2017-06-24 ENCOUNTER — Ambulatory Visit: Payer: BLUE CROSS/BLUE SHIELD | Admitting: Internal Medicine

## 2017-07-01 ENCOUNTER — Ambulatory Visit (INDEPENDENT_AMBULATORY_CARE_PROVIDER_SITE_OTHER): Payer: BLUE CROSS/BLUE SHIELD | Admitting: Internal Medicine

## 2017-07-01 ENCOUNTER — Other Ambulatory Visit (INDEPENDENT_AMBULATORY_CARE_PROVIDER_SITE_OTHER): Payer: BLUE CROSS/BLUE SHIELD

## 2017-07-01 ENCOUNTER — Encounter: Payer: Self-pay | Admitting: Internal Medicine

## 2017-07-01 ENCOUNTER — Other Ambulatory Visit: Payer: Self-pay | Admitting: Internal Medicine

## 2017-07-01 VITALS — BP 140/80 | HR 75 | Temp 97.6°F | Resp 16 | Ht 65.5 in | Wt 212.0 lb

## 2017-07-01 DIAGNOSIS — E785 Hyperlipidemia, unspecified: Secondary | ICD-10-CM

## 2017-07-01 DIAGNOSIS — D539 Nutritional anemia, unspecified: Secondary | ICD-10-CM

## 2017-07-01 DIAGNOSIS — M10371 Gout due to renal impairment, right ankle and foot: Secondary | ICD-10-CM

## 2017-07-01 DIAGNOSIS — E211 Secondary hyperparathyroidism, not elsewhere classified: Secondary | ICD-10-CM

## 2017-07-01 DIAGNOSIS — E038 Other specified hypothyroidism: Secondary | ICD-10-CM

## 2017-07-01 DIAGNOSIS — G4730 Sleep apnea, unspecified: Secondary | ICD-10-CM | POA: Insufficient documentation

## 2017-07-01 DIAGNOSIS — E039 Hypothyroidism, unspecified: Secondary | ICD-10-CM

## 2017-07-01 DIAGNOSIS — Z1231 Encounter for screening mammogram for malignant neoplasm of breast: Secondary | ICD-10-CM

## 2017-07-01 HISTORY — DX: Sleep apnea, unspecified: G47.30

## 2017-07-01 LAB — CBC WITH DIFFERENTIAL/PLATELET
BASOS ABS: 0.1 10*3/uL (ref 0.0–0.1)
Basophils Relative: 1.3 % (ref 0.0–3.0)
EOS ABS: 0.3 10*3/uL (ref 0.0–0.7)
EOS PCT: 5.7 % — AB (ref 0.0–5.0)
HCT: 38.5 % (ref 36.0–46.0)
Hemoglobin: 12.5 g/dL (ref 12.0–15.0)
LYMPHS ABS: 1.3 10*3/uL (ref 0.7–4.0)
Lymphocytes Relative: 24.7 % (ref 12.0–46.0)
MCHC: 32.4 g/dL (ref 30.0–36.0)
MCV: 88 fl (ref 78.0–100.0)
MONO ABS: 0.3 10*3/uL (ref 0.1–1.0)
MONOS PCT: 5.9 % (ref 3.0–12.0)
NEUTROS PCT: 62.4 % (ref 43.0–77.0)
Neutro Abs: 3.4 10*3/uL (ref 1.4–7.7)
PLATELETS: 286 10*3/uL (ref 150.0–400.0)
RBC: 4.37 Mil/uL (ref 3.87–5.11)
RDW: 14.9 % (ref 11.5–15.5)
WBC: 5.4 10*3/uL (ref 4.0–10.5)

## 2017-07-01 LAB — IBC PANEL
Iron: 98 ug/dL (ref 42–145)
Saturation Ratios: 22.8 % (ref 20.0–50.0)
Transferrin: 307 mg/dL (ref 212.0–360.0)

## 2017-07-01 LAB — LIPID PANEL
CHOL/HDL RATIO: 4
Cholesterol: 213 mg/dL — ABNORMAL HIGH (ref 0–200)
HDL: 53.8 mg/dL (ref >39.00)
NONHDL: 159.47
TRIGLYCERIDES: 221 mg/dL — AB (ref 0.0–149.0)
VLDL: 44.2 mg/dL — ABNORMAL HIGH (ref 0.0–40.0)

## 2017-07-01 LAB — LDL CHOLESTEROL, DIRECT: LDL DIRECT: 104 mg/dL

## 2017-07-01 LAB — LAB REPORT - SCANNED
Calcium, Ser: 10.1
PHOSPHORUS: 3.2
PTH: 121
Uric Acid, Serum: 9.6

## 2017-07-01 LAB — FERRITIN: Ferritin: 94.1 ng/mL (ref 10.0–291.0)

## 2017-07-01 LAB — FOLATE: Folate: >23.9 ng/mL (ref >5.9)

## 2017-07-01 LAB — VITAMIN B12: Vitamin B-12: 382 pg/mL (ref 211–911)

## 2017-07-01 MED ORDER — FEBUXOSTAT 40 MG PO TABS: 40.0000 mg | ORAL_TABLET | Freq: Every day | ORAL | 1 refills | Status: DC

## 2017-07-01 NOTE — Progress Notes (Signed)
Subjective:  Patient ID: [name], female    DOB: 12/01/1950  Age: 66 y.o. MRN: [id number]  CC: Anemia and Hypothyroidism   HPI [name] presents for establishing as a new patient.  She has a history of chronic renal insufficiency due to an episode of glomerulonephritis, secondary hyperparathyroidism with a recently elevated PTH, subclinical hypothyroidism, and frequent episodes of gout with an elevated uric acid level.  She is very active, plays golf 3 times a week and denies CP, DOE, palpitations, edema, or dizziness.  History [name] has a past medical history of Anemia; Arthritis; Asthma in adult; Cancer ([place]); CKD (chronic kidney disease), stage III ([place]); Colon cancer ([place]) (02/22/2014); Colon polyps (12/2002); GERD (gastroesophageal reflux disease); H/O Guillain-Barre syndrome; Headache(784.0); Heart murmur; History of endometrial cancer; HLD (hyperlipidemia); Hypertension; Hypothyroidism; Peripheral vascular disease ([place]); Post-streptococcal glomerulonephritis; Sleep apnea (07/01/2017); and TMJ (dislocation of temporomandibular joint).   She has a past surgical history that includes Total abdominal hysterectomy w/ bilateral salpingoophorectomy; Exploratory laparotomy; Lymphadenectomy; Colonoscopy w/ polypectomy; Renal biopsy; Tonsillectomy and adenoidectomy; Knee arthroscopy (Right); NM MYOVIEW LTD (02/03/2014); transthoracic echocardiogram (02/03/2014); 48 Hr Holter Monitor (01/2014); Abdominal hysterectomy; Right colectomy (04/10/2014); Laparoscopic right colectomy (Right, 04/10/2014); Ventral hernia repair (N/A, 08/23/2015); Insertion of mesh (N/A, 08/23/2015); Hip Arthroplasty; Colonoscopy; and Upper gastrointestinal endoscopy.   Her family history includes Breast cancer (age of onset: 60) in her paternal aunt; Colon cancer (age of onset: 63) in her sister; Colon cancer (age of onset: 11) in her father; Colon cancer (age of onset: 59) in her maternal grandfather; Colon cancer  (age of onset: 63) in her cousin; Colon polyps in her father; Colonic polyp in her sister; Diabetes in her mother and sister; Pancreatic cancer (age of onset: 17) in her maternal grandmother; Valvular heart disease in her father.She reports that she has never smoked. She has never used smokeless tobacco. She reports that she drinks alcohol. She reports that she does not use drugs.  Outpatient Medications Prior to Visit  Medication Sig Dispense Refill  . atorvastatin (LIPITOR) 10 MG tablet Take 10 mg by mouth at bedtime.     Marland Kitchen diltiazem (CARDIZEM CD) 240 MG 24 hr capsule Take 240 mg by mouth daily before breakfast.     . furosemide (LASIX) 40 MG tablet Take 40 mg by mouth daily before breakfast.     . nebivolol (BYSTOLIC) 2.5 MG tablet Take 2.5 mg by mouth daily.    . prednisoLONE acetate (PRED FORTE) 1 % ophthalmic suspension instill 1 drop into left eye four times a day  0  . 0.9 %  sodium chloride infusion      No facility-administered medications prior to visit.     ROS Review of Systems  Constitutional: Positive for fatigue. Negative for diaphoresis and unexpected weight change.  HENT: Negative.   Eyes: Negative for visual disturbance.  Respiratory: Positive for apnea. Negative for cough, chest tightness, shortness of breath and wheezing.        She complains of heavy snoring and thinks she has sleep apnea  Cardiovascular: Negative for chest pain, palpitations and leg swelling.  Gastrointestinal: Negative for abdominal pain, blood in stool, constipation, diarrhea, nausea and vomiting.  Endocrine: Negative for cold intolerance and heat intolerance.  Genitourinary: Negative.   Musculoskeletal: Negative.  Negative for arthralgias and myalgias.  Skin: Negative.  Negative for color change and rash.  Allergic/Immunologic: Negative.   Neurological: Negative.  Negative for dizziness, weakness, numbness and headaches.  Hematological: Negative.  Negative for adenopathy.  Does not bruise/bleed  easily.  Psychiatric/Behavioral: Negative.     Objective:  BP 140/80 (BP Location: Left Arm, Patient Position: Sitting, Cuff Size: Large)   Pulse 75   Temp 97.6 F (36.4 C) (Oral)   Resp 16   Ht 5' 5.5" (1.664 m)   Wt 212 lb (96.2 kg)   SpO2 98%   BMI 34.74 kg/m   Physical Exam  Constitutional: She is oriented to person, place, and time. No distress.  HENT:  Mouth/Throat: Oropharynx is clear and moist. No oropharyngeal exudate.  Eyes: Conjunctivae are normal. Right eye exhibits no discharge. Left eye exhibits no discharge. No scleral icterus.  Neck: Normal range of motion. Neck supple. No JVD present. No thyromegaly present.  Cardiovascular: Normal rate, regular rhythm and intact distal pulses.  Exam reveals no gallop and no friction rub.   No murmur heard. Pulmonary/Chest: Effort normal and breath sounds normal. No respiratory distress. She has no wheezes. She has no rales. She exhibits no tenderness.  Abdominal: Soft. Bowel sounds are normal. She exhibits no distension and no mass. There is no tenderness. There is no rebound and no guarding.  Musculoskeletal: Normal range of motion. She exhibits no edema, tenderness or deformity.  Lymphadenopathy:    She has no cervical adenopathy.  Neurological: She is alert and oriented to person, place, and time.  Skin: Skin is warm and dry. No rash noted. She is not diaphoretic. No erythema. No pallor.  Vitals reviewed.   Lab Results  Component Value Date   WBC 5.4 07/01/2017   HGB 12.5 07/01/2017   HCT 38.5 07/01/2017   PLT 286.0 07/01/2017   GLUCOSE 99 03/04/2016   CHOL 213 (H) 07/01/2017   TRIG 221.0 (H) 07/01/2017   HDL 53.80 07/01/2017   LDLDIRECT 104.0 07/01/2017   NA 139 06/15/2017   K 4.4 06/15/2017   CL 106 08/15/2015   CREATININE 1.5 (A) 06/15/2017   BUN 35 (A) 06/15/2017   CO2 22 03/04/2016   TSH 4.56 (H) 07/01/2017   INR 1.00 08/23/2015    Assessment & Plan:   [name] was seen today for anemia and  hypothyroidism.  Diagnoses and all orders for this visit:  Subclinical hypothyroidism-her TSH is very slightly elevated but her other TFTs are normal.  I do not think she would benefit from starting thyroid replacement therapy. -     Thyroid Panel With TSH; Future  Dyslipidemia, goal LDL below 130-her ASCVD risk score is not elevated therefore I do not recommend that she start taking a statin for CV risk reduction. -     Lipid panel; Future  Deficiency anemia-her anemia has resolved and her vitamin levels are normal.  We will continue to monitor this. -     Vitamin B12; Future -     IBC panel; Future -     Folate; Future -     Ferritin; Future -     Vitamin B1; Future -     CBC with Differential/Platelet; Future  Hyperparathyroidism due to vitamin D deficiency (HCC)-she has a slightly low vitamin D level and a recent Ca++ level of 10.1 so I asked her to avoid calcium supplements but to start taking a vitamin D supplement. -     Vitamin D 1,25 dihydroxy; Future -     VITAMIN D 25 Hydroxy (Vit-D Deficiency, Fractures); Future -     Cholecalciferol 2000 units TABS; Take 1 tablet (2,000 Units total) by mouth daily.  Sleep apnea, unspecified type -  Ambulatory referral to Sleep Studies  Gout due to renal impairment involving toe of right foot, unspecified chronicity-recent uric acid level was above 9 so I have asked her to start taking ULORIC to lower her uric acid level and to prevent the complications of gout. -     Discontinue: febuxostat (ULORIC) 40 MG tablet; Take 1 tablet (40 mg total) by mouth daily. -     febuxostat (ULORIC) 40 MG tablet; Take 1 tablet (40 mg total) by mouth daily.   I have discontinued [name]'s prednisoLONE acetate. I am also having her start on Cholecalciferol. Additionally, I am having her maintain her atorvastatin, diltiazem, furosemide, nebivolol, and febuxostat. We will stop administering sodium chloride.  Meds ordered this encounter  Medications    . DISCONTD: febuxostat (ULORIC) 40 MG tablet    Sig: Take 1 tablet (40 mg total) by mouth daily.    Dispense:  90 tablet    Refill:  1  . febuxostat (ULORIC) 40 MG tablet    Sig: Take 1 tablet (40 mg total) by mouth daily.    Dispense:  90 tablet    Refill:  1  . Cholecalciferol 2000 units TABS    Sig: Take 1 tablet (2,000 Units total) by mouth daily.    Dispense:  90 tablet    Refill:  1     Follow-up: Return in about 3 months (around 10/01/2017).  Scarlette Calico, MD

## 2017-07-01 NOTE — Patient Instructions (Signed)
Anemia, Nonspecific Anemia is a condition in which the concentration of red blood cells or hemoglobin in the blood is below normal. Hemoglobin is a substance in red blood cells that carries oxygen to the tissues of the body. Anemia results in not enough oxygen reaching these tissues. What are the causes? Common causes of anemia include:  Excessive bleeding. Bleeding may be internal or external. This includes excessive bleeding from periods (in women) or from the intestine.  Poor nutrition.  Chronic kidney, thyroid, and liver disease.  Bone marrow disorders that decrease red blood cell production.  Cancer and treatments for cancer.  HIV, AIDS, and their treatments.  Spleen problems that increase red blood cell destruction.  Blood disorders.  Excess destruction of red blood cells due to infection, medicines, and autoimmune disorders. What are the signs or symptoms?  Minor weakness.  Dizziness.  Headache.  Palpitations.  Shortness of breath, especially with exercise.  Paleness.  Cold sensitivity.  Indigestion.  Nausea.  Difficulty sleeping.  Difficulty concentrating. Symptoms may occur suddenly or they may develop slowly. How is this diagnosed? Additional blood tests are often needed. These help your health care provider determine the best treatment. Your health care provider will check your stool for blood and look for other causes of blood loss. How is this treated? Treatment varies depending on the cause of the anemia. Treatment can include:  Supplements of iron, vitamin B12, or folic acid.  Hormone medicines.  A blood transfusion. This may be needed if blood loss is severe.  Hospitalization. This may be needed if there is significant continual blood loss.  Dietary changes.  Spleen removal. Follow these instructions at home: Keep all follow-up appointments. It often takes many weeks to correct anemia, and having your health care provider check on your  condition and your response to treatment is very important. Get help right away if:  You develop extreme weakness, shortness of breath, or chest pain.  You become dizzy or have trouble concentrating.  You develop heavy vaginal bleeding.  You develop a rash.  You have bloody or black, tarry stools.  You faint.  You vomit up blood.  You vomit repeatedly.  You have abdominal pain.  You have a fever or persistent symptoms for more than 2-3 days.  You have a fever and your symptoms suddenly get worse.  You are dehydrated. This information is not intended to replace advice given to you by your health care provider. Make sure you discuss any questions you have with your health care provider. Document Released: 10/09/2004 Document Revised: 02/13/2016 Document Reviewed: 02/25/2013 Elsevier Interactive Patient Education  2017 [employer]vier Inc.  

## 2017-07-03 ENCOUNTER — Telehealth: Payer: Self-pay | Admitting: Internal Medicine

## 2017-07-03 NOTE — Telephone Encounter (Signed)
Pt called for her results from 10/17 Please call back  Pt informed Ronnald Ramp is not here today

## 2017-07-04 ENCOUNTER — Encounter: Payer: Self-pay | Admitting: Internal Medicine

## 2017-07-04 MED ORDER — CHOLECALCIFEROL 50 MCG (2000 UT) PO TABS: 1.0000 | ORAL_TABLET | Freq: Every day | ORAL | 1 refills | Status: AC

## 2017-07-05 ENCOUNTER — Encounter: Payer: Self-pay | Admitting: Internal Medicine

## 2017-07-05 LAB — VITAMIN B1: VITAMIN B1 (THIAMINE): 12 nmol/L (ref 8–30)

## 2017-07-05 LAB — THYROID PANEL WITH TSH
Free Thyroxine Index: 2.4 (ref 1.4–3.8)
T3 UPTAKE: 25 % (ref 22–35)
T4 TOTAL: 9.5 ug/dL (ref 5.1–11.9)
TSH: 4.56 m[IU]/L — AB (ref 0.40–4.50)

## 2017-07-05 LAB — VITAMIN D 1,25 DIHYDROXY
VITAMIN D3 1, 25 (OH): 27 pg/mL
Vitamin D 1, 25 (OH)2 Total: 27 pg/mL (ref 18–72)
Vitamin D2 1, 25 (OH)2: <8 pg/mL

## 2017-07-05 LAB — VITAMIN D 25 HYDROXY (VIT D DEFICIENCY, FRACTURES): Vit D, 25-Hydroxy: 29 ng/mL — ABNORMAL LOW (ref 30–100)

## 2017-07-06 ENCOUNTER — Telehealth: Payer: Self-pay

## 2017-07-06 NOTE — Telephone Encounter (Signed)
Is allopurinol an option for this pt?   The clinical information is asking if she has and I don't see it on her historical med list.

## 2017-07-06 NOTE — Telephone Encounter (Signed)
Key: [id number]

## 2017-07-06 NOTE — Telephone Encounter (Signed)
No, she has renal impairment uloric is best

## 2017-07-07 NOTE — Telephone Encounter (Signed)
[name] from [facility] called needing clinical information for this PA. He said that he is also faxing over a form that can be completed.

## 2017-07-08 NOTE — Telephone Encounter (Signed)
Form completed and faxed. 

## 2017-07-13 ENCOUNTER — Telehealth: Payer: Self-pay | Admitting: Internal Medicine

## 2017-07-13 NOTE — Telephone Encounter (Signed)
Pt called asking to change from Uloric to Allamopril (? this is what the patient said.. she spelled it out). Please advise.

## 2017-07-14 ENCOUNTER — Other Ambulatory Visit: Payer: Self-pay | Admitting: Internal Medicine

## 2017-07-14 DIAGNOSIS — M1A3710 Chronic gout due to renal impairment, right ankle and foot, without tophus (tophi): Secondary | ICD-10-CM

## 2017-07-14 MED ORDER — ALLOPURINOL 100 MG PO TABS: 100.0000 mg | ORAL_TABLET | Freq: Every day | ORAL | 0 refills | Status: DC

## 2017-07-14 NOTE — Telephone Encounter (Signed)
Pt informed of same.  

## 2017-07-14 NOTE — Telephone Encounter (Signed)
done

## 2017-07-14 NOTE — Telephone Encounter (Signed)
Contacted pt and informed that the Uloric was prescribed instead of the Allopurinol due to renal impairment.  Pt is requesting to change the prescription to allopurinol. Please advise

## 2017-07-27 ENCOUNTER — Encounter: Payer: Self-pay | Admitting: Gastroenterology

## 2017-08-04 ENCOUNTER — Ambulatory Visit: Payer: BLUE CROSS/BLUE SHIELD

## 2017-08-10 ENCOUNTER — Institutional Professional Consult (permissible substitution): Payer: Self-pay | Admitting: Neurology

## 2017-08-14 ENCOUNTER — Ambulatory Visit: Payer: BLUE CROSS/BLUE SHIELD | Admitting: Family Medicine

## 2017-08-17 ENCOUNTER — Institutional Professional Consult (permissible substitution): Payer: Self-pay | Admitting: Neurology

## 2017-09-02 ENCOUNTER — Ambulatory Visit: Payer: Self-pay

## 2017-09-02 ENCOUNTER — Ambulatory Visit: Payer: BLUE CROSS/BLUE SHIELD | Admitting: Family Medicine

## 2017-09-02 ENCOUNTER — Encounter: Payer: Self-pay | Admitting: Family Medicine

## 2017-09-02 VITALS — BP 140/90 | HR 83 | Temp 97.7°F | Ht 65.5 in | Wt 211.0 lb

## 2017-09-02 DIAGNOSIS — M10372 Gout due to renal impairment, left ankle and foot: Secondary | ICD-10-CM | POA: Diagnosis not present

## 2017-09-02 DIAGNOSIS — N183 Chronic kidney disease, stage 3 unspecified: Secondary | ICD-10-CM

## 2017-09-02 MED ORDER — PREDNISONE 10 MG PO TABS: ORAL_TABLET | ORAL | 0 refills | Status: DC

## 2017-09-02 MED ORDER — HYDROCODONE-ACETAMINOPHEN 10-325 MG PO TABS: 1.0000 | ORAL_TABLET | Freq: Three times a day (TID) | ORAL | 0 refills | Status: DC | PRN

## 2017-09-02 MED ORDER — FEBUXOSTAT 40 MG PO TABS: 40.0000 mg | ORAL_TABLET | Freq: Every day | ORAL | 11 refills | Status: DC

## 2017-09-02 NOTE — Patient Instructions (Signed)
It was so good seeing you again! Thank you for establishing with my new practice and allowing me to continue caring for you. It means a lot to me.   Please schedule a follow up appointment with me in 3 months to recheck your gout levels.   Please start the uloric 5 days PRIOR to completing the prednisone.   Let me know if you need more help if not partially improved by Friday.   Merry Christmas!

## 2017-09-02 NOTE — Progress Notes (Signed)
Subjective  CC:  Chief Complaint  Patient presents with  . Left foot pain    started Sunday  . Foot Swelling    left    HPI: [name] is a 66 y.o. female who presents to the office today to address the problems listed above in the chief complaint. She is a former [facility] patient and is here to reestablish care with me today.  I have reviewed her recent records, and my recent notes.  I reviewed her lab work.   She presents today with a severely painful left great toe.  She has history of gout.  Symptoms started about 48 hours ago and are unrelenting.  Hurts to move the toe, wear socks, walk.  She has used Advil for 1 dose with only minimal relief of pain.  We are trying to avoid NSAIDs due to her chronic kidney disease.  She has noticed some redness in the area and worries about infection.  No fevers chills.  Her most recent uric acid level was elevated above 9.  She took one allopurinol dose yesterday but otherwise has not been taking it.  She tolerates prednisone.  No calf pain.  She reports her first gout attack was within the last year.  No prior history of gout.  I reviewed her problem list and updated it appropriately. I reviewed the patients updated PMH, FH, and SocHx.    Patient Active Problem List   Diagnosis Date Noted  . Deficiency anemia 07/01/2017  . Hyperparathyroidism due to vitamin D deficiency ([place]) 07/01/2017  . Sleep apnea 07/01/2017  . Gout due to renal impairment involving toe of right foot 07/01/2017  . History of right hip replacement 06/25/2016  . Ventral hernia without obstruction or gangrene 08/23/2015  . Genetic testing 08/17/2015  . Asthma, mild intermittent 06/20/2015  . History of DVT (deep vein thrombosis) 05/22/2015  . Pulmonary nodule 05/18/2015  . Cecal cancer ([place]) 03/13/2014  . Adenocarcinoma of large intestine ([place]) 03/06/2014  . Adenocarcinoma of colon ([place]) 03/06/2014  . Diastolic congestive heart failure ([place]) 02/18/2014  .  Nonrheumatic aortic valve disorder 02/06/2014  . Dyslipidemia, goal LDL below 130   . Subclinical hypothyroidism 07/01/2013  . Endometrial cancer ([place]) 06/29/2013  . Chronic kidney disease (CKD), stage III (moderate) ([place]) 05/31/2013  . H/O Guillain-Barre syndrome 05/31/2013  . Renovascular hypertension 05/31/2013   Current Meds  Medication Sig  . atorvastatin (LIPITOR) 10 MG tablet Take 10 mg by mouth at bedtime.   . Cholecalciferol 2000 units TABS Take 1 tablet (2,000 Units total) by mouth daily.  Marland Kitchen diltiazem (CARDIZEM CD) 240 MG 24 hr capsule Take 240 mg by mouth daily before breakfast.   . furosemide (LASIX) 40 MG tablet Take 40 mg by mouth as needed.   . nebivolol (BYSTOLIC) 2.5 MG tablet Take 2.5 mg by mouth daily.  . [DISCONTINUED] allopurinol (ZYLOPRIM) 100 MG tablet Take 1 tablet (100 mg total) by mouth daily.    Allergies: Patient is allergic to penicillins and sulfa antibiotics. Family History: Patient family history includes Breast cancer (age of onset: 11) in her paternal aunt; Colon cancer (age of onset: 66) in her sister; Colon cancer (age of onset: 9) in her father; Colon cancer (age of onset: 57) in her maternal grandfather; Colon cancer (age of onset: 29) in her cousin; Colon polyps in her father; Colonic polyp in her sister; Diabetes in her mother and sister; Pancreatic cancer (age of onset: 40) in her maternal grandmother; Valvular heart disease  in her father. Social History:  Patient  reports that  has never smoked. she has never used smokeless tobacco. She reports that she drinks alcohol. She reports that she does not use drugs.  Review of Systems: Constitutional: Negative for fever malaise or anorexia Cardiovascular: negative for chest pain Respiratory: negative for SOB or persistent cough Gastrointestinal: negative for abdominal pain  Objective  Vitals: BP 140/90 (BP Location: Left Arm, Patient Position: Sitting, Cuff Size: Large)   Pulse 83   Temp 97.7 F  (36.5 C) (Oral)   Ht 5' 5.5" (1.664 m)   Wt 211 lb (95.7 kg)   SpO2 97%   BMI 34.58 kg/m  General: Appears to be in pain, no respiratory distress, A&Ox3 Cardiovascular:  RRR without murmur or gallop.  No lower extremity edema Respiratory:  Good breath sounds bilaterally, CTAB with normal respiratory effort Skin:  Warm, no rashes Antalgic gait, left foot with swollen red warm first MTP, exquisitely tender to light touch or movement.  Mild swelling of the foot dorsally, normal +2 distal pulses.  Normal ankle, no cords or calf tenderness present  Assessment  1. Acute gout due to renal impairment involving toe of left foot   2. Chronic kidney disease (CKD), stage III (moderate) (HCC)      Plan   Acute gout: Educated in counseling done.  Start prednisone, long taper.  Hydrocodone for pain.  Avoid all NSAIDs.  Educated on use of preventatives.  Agree with uloric to start at the end of her prednisone taper to avoid worsening her exacerbation.  Patient to follow-up in 48 hours if not improving.  She will be traveling for the holidays out of state.  Follow-up 3 months to recheck her levels if she becomes stable on uloric     Commons side effects, risks, benefits, and alternatives for medications and treatment plan prescribed today were discussed, and the patient expressed understanding of the given instructions. Patient is instructed to call or message via MyChart if he/she has any questions or concerns regarding our treatment plan. No barriers to understanding were identified. We discussed Red Flag symptoms and signs in detail. Patient expressed understanding regarding what to do in case of urgent or emergency type symptoms.   Medication list was reconciled, printed and provided to the patient in AVS. Patient instructions and summary information was reviewed with the patient as documented in the AVS. This note was prepared with assistance of Dragon voice recognition software. Occasional  wrong-word or sound-a-like substitutions may have occurred due to the inherent limitations of voice recognition software  No orders of the defined types were placed in this encounter.  Meds ordered this encounter  Medications  . predniSONE (DELTASONE) 10 MG tablet    Sig: Take 6 tabs qd x 3d, 4 tabs qd x 3d, 3 tabs qd x 3d, 2 tabs qd x 3d, 1 tab qd x 3d    Dispense:  48 tablet    Refill:  0  . HYDROcodone-acetaminophen (NORCO) 10-325 MG tablet    Sig: Take 1 tablet by mouth every 8 (eight) hours as needed.    Dispense:  30 tablet    Refill:  0  . febuxostat (ULORIC) 40 MG tablet    Sig: Take 1 tablet (40 mg total) by mouth daily.    Dispense:  30 tablet    Refill:  11

## 2017-09-02 NOTE — Telephone Encounter (Signed)
Pt called with c/o left foot swelling. Pt states she has gout. Pt states its the joint beside the great toe on the left foot. Pt states the foot is red and feels warmer than the right foot. Pt has tried icing and Ibuprofen. Pt states she is unable to sleep due to the pain. Disposition was PCP with in four hours or U[facility]. Pt would rather see PCP. No appts at [facility], [place] states she has seen Dr Jonni Sanger before and is willing to go to another site. Appt made for 1045 today with Dr. Jonni Sanger. Reason for Disposition . [1] SEVERE pain (e.g., excruciating, unable to do any normal activities) AND [2] not improved after 2 hours of pain medicine  Answer Assessment - Initial Assessment Questions 1. ONSET: "When did the pain start?"      Sunday  2. LOCATION: "Where is the pain located?"      The joint right beside the big toe on the left side 3. PAIN: "How bad is the pain?"    (Scale 1-10; or mild, moderate, severe)   -  MILD (1-3): doesn't interfere with normal activities    -  MODERATE (4-7): interferes with normal activities (e.g., work or school) or awakens from sleep, limping    -  SEVERE (8-10): excruciating pain, unable to do any normal activities, unable to walk     8/10. Can sleep due to sheet touching it 4. WORK OR EXERCISE: "Has there been any recent work or exercise that involved this part of the body?"      No Pt has a h/o gout and is on allopurinol 5. CAUSE: "What do you think is causing the foot pain?"     gout 6. OTHER SYMPTOMS: "Do you have any other symptoms?" (e.g., leg pain, rash, fever, numbness)     Redness and edema states it feels numb due to edema 7. PREGNANCY: "Is there any chance you are pregnant?" "When was your last menstrual period?"     n/a  Protocols used: FOOT PAIN-A-AH

## 2017-09-11 ENCOUNTER — Telehealth: Payer: Self-pay | Admitting: Internal Medicine

## 2017-09-11 NOTE — Telephone Encounter (Signed)
Pt stated that she was not sure about when to start the Uloric since she is stopping the prednisone early and the original start for Uloric was 5 days after stopping the prednisone.   Informed pt to keep the plan to start the Uloric 5 days after stopping the prednisone event though she is stopping the prednisone early.

## 2017-09-11 NOTE — Telephone Encounter (Signed)
Pt stopped taking prednisone, stating she can't take it anymore. Pt wants to know if she needs to continue to take Uloric due to her stopping prednisone.

## 2017-09-11 NOTE — Telephone Encounter (Signed)
Copied from [place]. Topic: Quick Communication - See Telephone Encounter >> Sep 11, 2017  9:20 AM Ahmed Prima L wrote: CRM for notification. See Telephone encounter for:   09/11/17.  Pt states she is on prednisone and has 12 left, she said she cant take it anymore. She wants to know does she need to take the [facility] on 1/9 or does she take it 5 days from today since she stopped taking the prednisone. Call back is [phone].

## 2017-09-17 ENCOUNTER — Ambulatory Visit
Admission: RE | Admit: 2017-09-17 | Discharge: 2017-09-17 | Disposition: A | Discharge status: Home / Self Care | Payer: BLUE CROSS/BLUE SHIELD | Source: Ambulatory Visit | Attending: Internal Medicine | Admitting: Internal Medicine

## 2017-09-17 DIAGNOSIS — Z1231 Encounter for screening mammogram for malignant neoplasm of breast: Secondary | ICD-10-CM | POA: Diagnosis not present

## 2017-09-17 LAB — HM MAMMOGRAPHY

## 2017-09-25 ENCOUNTER — Other Ambulatory Visit: Payer: Self-pay

## 2017-09-25 ENCOUNTER — Ambulatory Visit (AMBULATORY_SURGERY_CENTER): Payer: Self-pay | Admitting: *Deleted

## 2017-09-25 VITALS — Ht 65.0 in | Wt 214.0 lb

## 2017-09-25 DIAGNOSIS — C180 Malignant neoplasm of cecum: Secondary | ICD-10-CM

## 2017-09-25 DIAGNOSIS — Z1509 Genetic susceptibility to other malignant neoplasm: Secondary | ICD-10-CM

## 2017-09-25 MED ORDER — PEG 3350-KCL-NA BICARB-NACL 420 G PO SOLR: 4000.0000 mL | Freq: Once | ORAL | 0 refills | Status: AC

## 2017-09-25 NOTE — Progress Notes (Signed)
Patient denies any allergies to eggs or soy. Patient denies any problems with anesthesia/sedation. Patient denies any oxygen use at home. Patient denies taking any diet/weight loss medications or blood thinners. EMMI education declined by pt.  

## 2017-09-28 ENCOUNTER — Telehealth: Payer: Self-pay | Admitting: Oncology

## 2017-09-28 ENCOUNTER — Inpatient Hospital Stay: Payer: Medicare Other | Attending: Oncology | Admitting: Oncology

## 2017-09-28 ENCOUNTER — Inpatient Hospital Stay: Payer: Medicare Other

## 2017-09-28 VITALS — BP 132/68 | HR 63 | Temp 98.1°F | Resp 18 | Ht 65.0 in | Wt 209.9 lb

## 2017-09-28 DIAGNOSIS — Z800 Family history of malignant neoplasm of digestive organs: Secondary | ICD-10-CM | POA: Diagnosis not present

## 2017-09-28 DIAGNOSIS — Z86718 Personal history of other venous thrombosis and embolism: Secondary | ICD-10-CM | POA: Insufficient documentation

## 2017-09-28 DIAGNOSIS — C180 Malignant neoplasm of cecum: Secondary | ICD-10-CM

## 2017-09-28 DIAGNOSIS — Z8049 Family history of malignant neoplasm of other genital organs: Secondary | ICD-10-CM | POA: Insufficient documentation

## 2017-09-28 DIAGNOSIS — Z85038 Personal history of other malignant neoplasm of large intestine: Secondary | ICD-10-CM | POA: Diagnosis not present

## 2017-09-28 DIAGNOSIS — C189 Malignant neoplasm of colon, unspecified: Secondary | ICD-10-CM

## 2017-09-28 LAB — CEA (IN HOUSE-CHCC): CEA (CHCC-In House): 3.42 ng/mL (ref 0.00–5.00)

## 2017-09-28 NOTE — Progress Notes (Addendum)
  [name] OFFICE PROGRESS NOTE   Diagnosis: Colon cancer  INTERVAL HISTORY:   [name] returns as scheduled.  She feels well.  Good appetite.  No difficulty with bowel function.  She was recently diagnosed with gout.  She is followed by [name].  She is scheduled for a colonoscopy later this month. She reports a niece has been diagnosed with "Lynch syndrome ".  A paternal second cousin died of metastatic colon cancer and had a history of breast cancer.  Objective:  Vital signs in last 24 hours:  Blood pressure 132/68, pulse 63, temperature 98.1 F (36.7 C), temperature source Oral, resp. rate 18, height '5\' 5"'$  (1.651 m), weight 209 lb 14.4 oz (95.2 kg), SpO2 100 %.    HEENT: Neck without mass Lymphatics: No cervical, supraclavicular, axillary, or inguinal nodes Resp: Lungs clear bilaterally Cardio: Regular rate and rhythm GI: No hepatosplenomegaly, no mass, nontender Vascular: No leg edema  Lab Results:   Lab Results  Component Value Date   CEA1 3.68 03/26/2017     Medications: I have reviewed the patient's current medications.   Assessment/Plan: 1. 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 Microsatellite instability-high, loss of MLH1 and PMS2 expression ? Negative mutation testing of the MLH1 gene ? 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 History of uterine cancer-2006  3. history of glomerulonephritis with chronic renal insufficiency  4. family history of multiple cancers including colon cancer  5. 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 Hypertension  7. History of anemia-microcytic, likely secondary to colon cancer  8. Abdominal incisional hernia, hernia repair 08/23/2015  9. 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 Negative right lower extremity Doppler at [facility] 03/10/2017   Disposition: [name] remains in clinical remission from colon cancer.  We will follow-up on the CEA from today.  She will undergo a surveillance colonoscopy later this month.  She would like to continue follow-up at the Cancer center.  She will return for an office visit and CEA in 1 year.  She will obtain the result of her nieces genetic testing and forward the results to [place].  15 minutes were spent with the patient today.  The majority of the time was used for counseling and coordination of care.   Betsy Coder, MD  09/28/2017  3:41 PM

## 2017-09-28 NOTE — Telephone Encounter (Signed)
Scheduled appt per 1/14 los- Gave patient AVS and calender per los. Lab and f/u in one year.

## 2017-09-29 ENCOUNTER — Encounter: Payer: Self-pay | Admitting: Family Medicine

## 2017-09-29 ENCOUNTER — Telehealth: Payer: Self-pay | Admitting: Gastroenterology

## 2017-09-29 NOTE — Telephone Encounter (Signed)
Thanks!!  Called pt- informed ok to change- will mail new instructions- we did discuss what she needs to buy and how to do prep over the phone- encourage to call with questions  Lelan Pons PV

## 2017-09-29 NOTE — Telephone Encounter (Signed)
She has a colon scheduled for  10-09-17-- Pt states last colon she had miralax - she states she cannot drink the golytely amount - she knows she cannot drink the amount of golytely - she states last colon she did miralax and the report states good prep   Is it okay to change her to miralax?  Please advise, Thanks, Marijean Niemann

## 2017-09-29 NOTE — Telephone Encounter (Signed)
Split dose miralax prep is OK with me.  thanks

## 2017-10-08 ENCOUNTER — Encounter: Payer: Self-pay | Admitting: Family Medicine

## 2017-10-08 ENCOUNTER — Ambulatory Visit: Payer: Self-pay

## 2017-10-08 ENCOUNTER — Ambulatory Visit (INDEPENDENT_AMBULATORY_CARE_PROVIDER_SITE_OTHER): Payer: Medicare Other | Admitting: Family Medicine

## 2017-10-08 VITALS — BP 138/86 | HR 71 | Temp 97.6°F | Ht 65.0 in | Wt 209.6 lb

## 2017-10-08 DIAGNOSIS — R0602 Shortness of breath: Secondary | ICD-10-CM

## 2017-10-08 DIAGNOSIS — Z86718 Personal history of other venous thrombosis and embolism: Secondary | ICD-10-CM

## 2017-10-08 DIAGNOSIS — L308 Other specified dermatitis: Secondary | ICD-10-CM | POA: Diagnosis not present

## 2017-10-08 MED ORDER — TRIAMCINOLONE ACETONIDE 0.1 % EX CREA: 1.0000 "application " | TOPICAL_CREAM | Freq: Two times a day (BID) | CUTANEOUS | 0 refills | Status: AC

## 2017-10-08 NOTE — Telephone Encounter (Signed)
  Reason for Disposition . Hives or itching  Answer Assessment - Initial Assessment Questions 1. APPEARANCE of RASH: "Describe the rash." (e.g., spots, blisters, raised areas, skin peeling, scaly)     Patchy 2. SIZE: "How big are the spots?" (e.g., tip of pen, eraser, coin; inches, centimeters)     Hives 3. LOCATION: "Where is the rash located?"     Legs, shoulders 4. COLOR: "What color is the rash?" (Note: It is difficult to assess rash color in people with darker-colored skin. When this situation occurs, simply ask the caller to describe what they see.)     Pink 5. ONSET: "When did the rash begin?"     Started 10/02/17 6. FEVER: "Do you have a fever?" If so, ask: "What is your temperature, how was it measured, and when did it start?"     No 7. ITCHING: "Does the rash itch?" If so, ask: "How bad is the itch?" (Scale 1-10; or mild, moderate, severe)      No itching 8. CAUSE: "What do you think is causing the rash?"     Medication 9. NEW MEDICATION: "What new medication are you taking?" (e.g., name of antibiotic) "When did you start taking this medication?".     Urloric 10. OTHER SYMPTOMS: "Do you have any other symptoms?" (e.g., sore throat, fever, joint pain)       Asthma,diarrhea 11. PREGNANCY: "Is there any chance you are pregnant?" "When was your last menstrual period?"       No  Protocols used: RASH - WIDESPREAD ON DRUGS-A-AH Pt. States that she has been on Urloric x 30 days and feels like this is causing her issues. Appointment made for today.

## 2017-10-08 NOTE — Progress Notes (Signed)
Subjective  CC:  Chief Complaint  Patient presents with  . Rash    Rash on Left & Right Inner Thighs and Left Shoulder   . Shortness of Breath    HPI: [name] is a 67 y.o. female who presents to the office today to address the problems listed above in the chief complaint.  Last week sore red flaking rash started on left inner thigh; since has spread to right inner thigh and left upper inner arm; was sore; never itched. No associated f/c/s or swelling. Inner thigh rash is resolving on its own. Has h/o mild psoriasis but never this large. No warmth to the rash.   4-5 weeks ago, while taking prednisone for gout (that finally resolved), she noted a tightness in her chest. Hard to take a deep breath in but denies dyspnea, DOE, PND, calf pain or swelling, chest pain. Notices it mostly at night when she lies down. Is stressed - tomorrow is f/u colonoscopy after for colon cancer surveillance. Didn't sleep well last night due to stress.  H/o DVT but no sxs of DVT now. Denies pleuritic chest pain.     I reviewed the patients updated PMH, FH, and SocHx.    Patient Active Problem List   Diagnosis Date Noted  . Deficiency anemia 07/01/2017  . Hyperparathyroidism due to vitamin D deficiency ([place]) 07/01/2017  . Sleep apnea 07/01/2017  . Gout due to renal impairment involving toe of right foot 07/01/2017  . History of right hip replacement 06/25/2016  . Ventral hernia without obstruction or gangrene 08/23/2015  . Genetic testing 08/17/2015  . Asthma, mild intermittent 06/20/2015  . History of DVT (deep vein thrombosis) 05/22/2015  . Pulmonary nodule 05/18/2015  . Cecal cancer ([place]) 03/13/2014  . Adenocarcinoma of large intestine ([place]) 03/06/2014  . Adenocarcinoma of colon ([place]) 03/06/2014  . Diastolic congestive heart failure ([place]) 02/18/2014  . Nonrheumatic aortic valve disorder 02/06/2014  . Dyslipidemia, goal LDL below 130   . Subclinical hypothyroidism 07/01/2013  .  Endometrial cancer ([place]) 06/29/2013  . Chronic kidney disease (CKD), stage III (moderate) ([place]) 05/31/2013  . H/O Guillain-Barre syndrome 05/31/2013  . Renovascular hypertension 05/31/2013   Current Meds  Medication Sig  . atorvastatin (LIPITOR) 10 MG tablet Take 10 mg by mouth at bedtime.   . Cholecalciferol 2000 units TABS Take 1 tablet (2,000 Units total) by mouth daily.  . clindamycin (CLEOCIN) 150 MG capsule Take 150 mg by mouth as needed. As needed for Dental Procedures  . diltiazem (CARDIZEM CD) 240 MG 24 hr capsule Take 240 mg by mouth daily before breakfast.   . docusate sodium (COLACE) 100 MG capsule Take 100 mg by mouth at bedtime.  . febuxostat (ULORIC) 40 MG tablet Take 1 tablet (40 mg total) by mouth daily.  . furosemide (LASIX) 40 MG tablet Take 40 mg by mouth daily.  . nebivolol (BYSTOLIC) 2.5 MG tablet Take 2.5 mg by mouth daily.    Allergies: Patient is allergic to penicillins and sulfa antibiotics. Family History: Patient family history includes Breast cancer (age of onset: 3) in her paternal aunt; Colon cancer (age of onset: 86) in her sister; Colon cancer (age of onset: 25) in her father; Colon cancer (age of onset: 23) in her maternal grandfather; Colon cancer (age of onset: 35) in her cousin; Colon polyps in her father; Colonic polyp in her sister; Diabetes in her mother and sister; Pancreatic cancer (age of onset: 49) in her maternal grandmother; Valvular heart disease in  her father. Social History:  Patient  reports that  has never smoked. she has never used smokeless tobacco. She reports that she does not drink alcohol or use drugs.  Review of Systems: Constitutional: Negative for fever malaise or anorexia Cardiovascular: negative for chest pain Respiratory: negative for SOB or persistent cough Gastrointestinal: negative for abdominal pain  Objective  Vitals: BP 138/86 (BP Location: Right Arm, Patient Position: Sitting, Cuff Size: Large)   Pulse 71   Temp  97.6 F (36.4 C) (Oral)   Ht 5\' 5"  (1.651 m)   Wt 209 lb 9.6 oz (95.1 kg)   SpO2 97%   BMI 34.88 kg/m  General: no acute distress , A&Ox3, no respiratory distress; breathing comfortably, mildly distracted today HEENT: PEERL, conjunctiva normal, Oropharynx moist,neck is supple, no jvd Cardiovascular:  RRR without murmur or gallop.  Respiratory:  Good breath sounds bilaterally, CTAB with normal respiratory effort Skin:  Warm, left upper inner arm and bilateral inner thighs with patchy salmon colored flaking rash w/o urticaria, vesicles, tenderness or warmth  Assessment  1. Psoriasiform dermatitis   2. SOB (shortness of breath)   3. History of DVT (deep vein thrombosis)      Plan   rash:  Treat with steroid cream bid x 1-2 weeks.   Sob - multifactorial but mainly believe related to anxiety. Reassured. IF sxs worsen or persist after colonoscopy, will get chest CT for PE protocol given her cancer history and DVT history. Pt understands and agrees with care plan.   Follow up: Return for recheck gout levels on uloric.    Commons side effects, risks, benefits, and alternatives for medications and treatment plan prescribed today were discussed, and the patient expressed understanding of the given instructions. Patient is instructed to call or message via MyChart if he/she has any questions or concerns regarding our treatment plan. No barriers to understanding were identified. We discussed Red Flag symptoms and signs in detail. Patient expressed understanding regarding what to do in case of urgent or emergency type symptoms.   Medication list was reconciled, printed and provided to the patient in AVS. Patient instructions and summary information was reviewed with the patient as documented in the AVS. This note was prepared with assistance of Dragon voice recognition software. Occasional wrong-word or sound-a-like substitutions may have occurred due to the inherent limitations of voice  recognition software  No orders of the defined types were placed in this encounter.  Meds ordered this encounter  Medications  . triamcinolone cream (KENALOG) 0.1 %    Sig: Apply 1 application topically 2 (two) times daily.    Dispense:  30 g    Refill:  0

## 2017-10-08 NOTE — Patient Instructions (Signed)
Please return in march as scheduled.  If you have any questions or concerns, please don't hesitate to send me a message via MyChart or call the office at [phone]. Thank you for visiting with [place] today! It's our pleasure caring for you.

## 2017-10-09 ENCOUNTER — Ambulatory Visit (AMBULATORY_SURGERY_CENTER): Payer: Medicare Other | Admitting: Gastroenterology

## 2017-10-09 ENCOUNTER — Encounter: Payer: Self-pay | Admitting: Gastroenterology

## 2017-10-09 ENCOUNTER — Other Ambulatory Visit: Payer: Self-pay

## 2017-10-09 VITALS — BP 147/57 | HR 57 | Temp 98.6°F | Resp 18 | Ht 65.0 in | Wt 214.0 lb

## 2017-10-09 DIAGNOSIS — D123 Benign neoplasm of transverse colon: Secondary | ICD-10-CM | POA: Diagnosis not present

## 2017-10-09 DIAGNOSIS — C180 Malignant neoplasm of cecum: Secondary | ICD-10-CM | POA: Diagnosis not present

## 2017-10-09 DIAGNOSIS — Z1211 Encounter for screening for malignant neoplasm of colon: Secondary | ICD-10-CM | POA: Diagnosis not present

## 2017-10-09 DIAGNOSIS — Z1509 Genetic susceptibility to other malignant neoplasm: Secondary | ICD-10-CM

## 2017-10-09 MED ORDER — SODIUM CHLORIDE 0.9 % IV SOLN: 500.0000 mL | Freq: Once | INTRAVENOUS | Status: DC

## 2017-10-09 NOTE — Patient Instructions (Signed)
**  NO ASPIRIN OR NSAIDS FOR 2 WEEKS**  *handout given on polyps*   YOU HAD AN ENDOSCOPIC PROCEDURE TODAY: Refer to the procedure report and other information in the discharge instructions given to you for any specific questions about what was found during the examination. If this information does not answer your questions, please call [place] office at [phone] to clarify.   YOU SHOULD EXPECT: Some feelings of bloating in the abdomen. Passage of more gas than usual. Walking can help get rid of the air that was put into your GI tract during the procedure and reduce the bloating. If you had a lower endoscopy (such as a colonoscopy or flexible sigmoidoscopy) you may notice spotting of blood in your stool or on the toilet paper. Some abdominal soreness may be present for a day or two, also.  DIET: Your first meal following the procedure should be a light meal and then it is ok to progress to your normal diet. A half-sandwich or bowl of soup is an example of a good first meal. Heavy or fried foods are harder to digest and may make you feel nauseous or bloated. Drink plenty of fluids but you should avoid alcoholic beverages for 24 hours. If you had a esophageal dilation, please see attached instructions for diet.    ACTIVITY: Your care partner should take you home directly after the procedure. You should plan to take it easy, moving slowly for the rest of the day. You can resume normal activity the day after the procedure however YOU SHOULD NOT DRIVE, use power tools, machinery or perform tasks that involve climbing or major physical exertion for 24 hours (because of the sedation medicines used during the test).   SYMPTOMS TO REPORT IMMEDIATELY: A gastroenterologist can be reached at any hour. Please call [phone]  for any of the following symptoms:  Following lower endoscopy (colonoscopy, flexible sigmoidoscopy) Excessive amounts of blood in the stool  Significant tenderness, worsening of  abdominal pains  Swelling of the abdomen that is new, acute  Fever of 100 or higher    FOLLOW UP:  If any biopsies were taken you will be contacted by phone or by letter within the next 1-3 weeks. Call [phone]  if you have not heard about the biopsies in 3 weeks.  Please also call with any specific questions about appointments or follow up tests.

## 2017-10-09 NOTE — Op Note (Signed)
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                            Toth.04/10/2014; Microsatellite instability-high,                            loss of MLH1 and PMS2 expression; Negative mutation                            testing of the MLH1 gene. Cancer was discovered by                            colonoscopy Dr. Teena Irani 02/2014. She has had                            4 family members with colon cancer including sister                            at 48, father in 26s. Also a cousin. Colonoscopy                            04/2015 Dr. Ardis Hughs found one small HP. Colonoscopy                            2017 no polyps. EGD 2017 was normal. Medicines:                Monitored Anesthesia Care Procedure:                Pre-Anesthesia Assessment:                           - Prior to the procedure, a History and Physical                            was performed, and patient medications and                            allergies were reviewed. The patient's tolerance of  previous anesthesia was also reviewed. The risks                            and benefits of the procedure and the sedation                            options and risks were discussed with the patient.                            All questions were answered, and informed consent                            was obtained. Prior Anticoagulants: The patient has                            taken no previous  anticoagulant or antiplatelet                            agents. ASA Grade Assessment: II - A patient with                            mild systemic disease. After reviewing the risks                            and benefits, the patient was deemed in                            satisfactory condition to undergo the procedure.                           After obtaining informed consent, the colonoscope                            was passed under direct vision. Throughout the                            procedure, the patient's blood pressure, pulse, and                            oxygen saturations were monitored continuously. The                            Model CF-HQ190L [id number]) scope was introduced                            through the anus and advanced to the the                            ileocolonic anastomosis. The colonoscopy was                            performed without difficulty. The patient tolerated  the procedure well. The quality of the bowel                            preparation was excellent. The rectum was                            photographed. Scope In: 10:01:51 AM Scope Out: 10:34:16 AM Scope Withdrawal Time: 0 hours 28 minutes 4 seconds  Total Procedure Duration: 0 hours 32 minutes 25 seconds  Findings:                 Normal IC anastomosis.                           Distal to the IC anastomosis (by 4-5cm) were three                            areas of concern. The first was 2cm across,                            sessile, mucous capped (SSP). This was removed with                            mixed snare/cautery and cold snare (path jar 1) and                            the site was labled with [place] Ink submucosal                            injection. The second (slightly more distal in the                            colon) was sessile, adenomatous appearing, 36m                            across, removed with piecemeal cold snare (jar  2).                            The third was slightly irregular mucosa with very                            indistinct margins (edema vs SSP), this was sampled                            with biospy (jar 3).                           The colon was otherwise normal. Complications:            No immediate complications. Estimated blood loss:                            None. Estimated Blood Loss:     Estimated blood loss: none. Impression:  Distal to the IC anastomosis (by 4-5cm) were three                            areas of concern. The first was 2cm across,                            sessile, mucous capped (SSP likely). This was                            removed with mixed snare/cautery and cold snare                            (path jar 1) and the site was labled with [place] Ink                            submucosal injection. The second (slightly more                            distal in the colon) was sessile, adenomatous                            appearing, 69m across, removed with piecemeal cold                            snare (jar 2). The third was slightly irregular                            mucosa with very indistinct margins (edema vs SSP),                            this was sampled with biospy (jar 3). Recommendation:           - Patient has a contact number available for                            emergencies. The signs and symptoms of potential                            delayed complications were discussed with the                            patient. Return to normal activities tomorrow.                            Written discharge instructions were provided to the                            patient.                           - Resume previous diet.                           - Continue present medications.                           -  Await pathology results. Likely repeat colonoscoy                            in 3-6 months. Milus Banister, MD 10/09/2017  10:57:09 AM This report has been signed electronically.

## 2017-10-09 NOTE — Progress Notes (Signed)
Called to room to assist during endoscopic procedure.  Patient ID and intended procedure confirmed with present staff. Received instructions for my participation in the procedure from the performing physician.  

## 2017-10-09 NOTE — Progress Notes (Signed)
To PACU, VSS. Report to [email or web address] 

## 2017-10-12 ENCOUNTER — Telehealth: Payer: Self-pay

## 2017-10-12 ENCOUNTER — Telehealth: Payer: Self-pay | Admitting: *Deleted

## 2017-10-12 ENCOUNTER — Telehealth: Payer: Self-pay | Admitting: Family Medicine

## 2017-10-12 NOTE — Telephone Encounter (Signed)
Copied from [place] [id number]. Topic: General - Other >> Oct 12, 2017  4:23 PM Lolita Rieger, [place] wrote: Reason for CRM: pt called and would like for a nurse to call her back in regards to her gout flare ups and her use of uloric Please call pt @ [phone]  >> Oct 12, 2017  4:53 PM [place], Isidoro Donning wrote: Routing to correct office.

## 2017-10-12 NOTE — Telephone Encounter (Signed)
This is not a patient at our location.

## 2017-10-12 NOTE — Telephone Encounter (Signed)
  Follow up Call-  Call back number 10/09/2017 05/30/2016 05/04/2015  Post procedure Call Back phone  # [phone] cell [phone]  Permission to leave phone message Yes Yes Yes  Some recent data might be hidden     Patient questions:  Do you have a fever, pain , or abdominal swelling? No. Pain Score  0 *  Have you tolerated food without any problems? Yes.    Have you been able to return to your normal activities? Yes.    Do you have any questions about your discharge instructions: Diet   No. Medications  No. Follow up visit  No.  Do you have questions or concerns about your Care? No.  Actions: * If pain score is 4 or above: No action needed, pain <4.

## 2017-10-12 NOTE — Telephone Encounter (Signed)
Copied from [place] [id number]. Topic: General - Other >> Oct 12, 2017  4:23 PM Lolita Rieger, [place] wrote: Reason for CRM: pt called and would like for a nurse to call her back in regards to her gout flare ups and her use of uloric Please call pt @ [phone]

## 2017-10-13 ENCOUNTER — Encounter: Payer: Self-pay | Admitting: Family Medicine

## 2017-10-13 MED ORDER — PREDNISONE 20 MG PO TABS: ORAL_TABLET | ORAL | 0 refills | Status: DC

## 2017-10-13 NOTE — Telephone Encounter (Signed)
Pt called back, said she has been taking Uloric x 5 weeks and she is having a gout flare up in left foot can hardly walk. Pt said she wants to know how long she has to take this to help? Told pt if you are having a flare you may need something else. Told pt I will send message to Dr. Jonni Sanger and get back to her. Pt verbalized understanding.

## 2017-10-13 NOTE — Telephone Encounter (Signed)
Pt with acute gout flare in foot again x 3 days with redness and swelling.  Last was mid December.  Started uloric 5 weeks so flare can be expected.   RECS: continue uloric, pred burst (pt favors over taper; watch for rebound symptoms), norco if needed.

## 2017-10-13 NOTE — Addendum Note (Signed)
Addended by: Billey Chang on: 10/13/2017 10:33 AM   Modules accepted: Orders

## 2017-10-13 NOTE — Telephone Encounter (Signed)
Dr. Jonni Sanger please see message and advise.

## 2017-10-13 NOTE — Telephone Encounter (Signed)
LM to call office. AP, CMA

## 2017-10-14 ENCOUNTER — Encounter: Payer: Self-pay | Admitting: Family Medicine

## 2017-10-15 ENCOUNTER — Encounter: Payer: Self-pay | Admitting: Oncology

## 2017-10-16 ENCOUNTER — Ambulatory Visit: Payer: Medicare Other | Admitting: Adult Health

## 2017-10-16 DIAGNOSIS — Z0289 Encounter for other administrative examinations: Secondary | ICD-10-CM

## 2017-10-19 ENCOUNTER — Encounter: Payer: Self-pay | Admitting: Gastroenterology

## 2017-10-27 ENCOUNTER — Other Ambulatory Visit (INDEPENDENT_AMBULATORY_CARE_PROVIDER_SITE_OTHER): Payer: Medicare Other

## 2017-10-27 ENCOUNTER — Encounter: Payer: Self-pay | Admitting: Internal Medicine

## 2017-10-27 ENCOUNTER — Ambulatory Visit (INDEPENDENT_AMBULATORY_CARE_PROVIDER_SITE_OTHER): Payer: Medicare Other | Admitting: Internal Medicine

## 2017-10-27 VITALS — BP 142/88 | HR 76 | Temp 98.6°F | Resp 16 | Wt 210.0 lb

## 2017-10-27 DIAGNOSIS — E039 Hypothyroidism, unspecified: Secondary | ICD-10-CM | POA: Diagnosis not present

## 2017-10-27 DIAGNOSIS — N183 Chronic kidney disease, stage 3 unspecified: Secondary | ICD-10-CM

## 2017-10-27 DIAGNOSIS — E211 Secondary hyperparathyroidism, not elsewhere classified: Secondary | ICD-10-CM

## 2017-10-27 DIAGNOSIS — M1A3710 Chronic gout due to renal impairment, right ankle and foot, without tophus (tophi): Secondary | ICD-10-CM | POA: Diagnosis not present

## 2017-10-27 DIAGNOSIS — E038 Other specified hypothyroidism: Secondary | ICD-10-CM

## 2017-10-27 DIAGNOSIS — I150 Renovascular hypertension: Secondary | ICD-10-CM

## 2017-10-27 DIAGNOSIS — I5030 Unspecified diastolic (congestive) heart failure: Secondary | ICD-10-CM | POA: Diagnosis not present

## 2017-10-27 LAB — URIC ACID: URIC ACID, SERUM: 5.6 mg/dL (ref 2.4–7.0)

## 2017-10-27 LAB — BASIC METABOLIC PANEL
BUN: 31 mg/dL — AB (ref 6–23)
CALCIUM: 9.9 mg/dL (ref 8.4–10.5)
CHLORIDE: 101 meq/L (ref 96–112)
CO2: 28 mEq/L (ref 19–32)
CREATININE: 1.57 mg/dL — AB (ref 0.40–1.20)
GFR: 35.14 mL/min — AB (ref >60.00)
Glucose, Bld: 105 mg/dL — ABNORMAL HIGH (ref 70–99)
Potassium: 4.3 mEq/L (ref 3.5–5.1)
Sodium: 137 mEq/L (ref 135–145)

## 2017-10-27 MED ORDER — CARVEDILOL 6.25 MG PO TABS
6.2500 mg | ORAL_TABLET | Freq: Two times a day (BID) | ORAL | 1 refills | Status: DC
Start: 2017-10-27 — End: 2018-02-22

## 2017-10-27 NOTE — Patient Instructions (Signed)

## 2017-10-27 NOTE — Progress Notes (Signed)
Subjective:  Patient ID: [name], female    DOB: Nov 14, 1950  Age: 67 y.o. MRN: [id number]  CC: Hypothyroidism and Hypertension   HPI [name] presents for f/up - She started taking Uloric about 2 months ago and since then has had no gout flares.  She feels well today and offers no complaints.  She tells me her blood pressure has been well controlled.  She complains that Bystolic is too expensive.  Outpatient Medications Prior to Visit  Medication Sig Dispense Refill  . atorvastatin (LIPITOR) 10 MG tablet Take 10 mg by mouth at bedtime.     . Cholecalciferol 2000 units TABS Take 1 tablet (2,000 Units total) by mouth daily. 90 tablet 1  . clindamycin (CLEOCIN) 150 MG capsule Take 150 mg by mouth as needed. As needed for Dental Procedures    . diltiazem (CARDIZEM CD) 240 MG 24 hr capsule Take 240 mg by mouth daily before breakfast.     . docusate sodium (COLACE) 100 MG capsule Take 100 mg by mouth at bedtime.    . febuxostat (ULORIC) 40 MG tablet Take 1 tablet (40 mg total) by mouth daily. 30 tablet 11  . furosemide (LASIX) 40 MG tablet Take 40 mg by mouth daily.    . predniSONE (DELTASONE) 20 MG tablet Take 3 tabs daily for 5 days 15 tablet 0  . triamcinolone cream (KENALOG) 0.1 % Apply 1 application topically 2 (two) times daily. 30 g 0  . nebivolol (BYSTOLIC) 2.5 MG tablet Take 2.5 mg by mouth daily.    Marland Kitchen 0.9 %  sodium chloride infusion      No facility-administered medications prior to visit.     ROS Review of Systems  Constitutional: Negative for appetite change, diaphoresis, fatigue and unexpected weight change.  HENT: Negative.   Eyes: Negative for visual disturbance.  Respiratory: Negative for cough, chest tightness, shortness of breath and wheezing.   Cardiovascular: Negative for chest pain, palpitations and leg swelling.  Gastrointestinal: Negative for abdominal pain, diarrhea, nausea and vomiting.  Endocrine: Negative for cold intolerance and heat  intolerance.  Genitourinary: Negative.  Negative for difficulty urinating.  Musculoskeletal: Negative.  Negative for arthralgias and myalgias.  Skin: Negative.  Negative for color change.  Allergic/Immunologic: Negative.   Neurological: Negative.  Negative for dizziness, weakness and light-headedness.  Hematological: Negative for adenopathy. Does not bruise/bleed easily.  Psychiatric/Behavioral: Negative.     Objective:  BP (!) 142/88   Pulse 76   Temp 98.6 F (37 C)   Resp 16   Wt 210 lb (95.3 kg)   SpO2 98%   BMI 34.95 kg/m   BP Readings from Last 3 Encounters:  10/27/17 (!) 142/88  10/09/17 (!) 147/57  10/08/17 138/86    Wt Readings from Last 3 Encounters:  10/27/17 210 lb (95.3 kg)  10/09/17 214 lb (97.1 kg)  10/08/17 209 lb 9.6 oz (95.1 kg)    Physical Exam  Constitutional: She is oriented to person, place, and time. No distress.  HENT:  Mouth/Throat: Oropharynx is clear and moist. No oropharyngeal exudate.  Eyes: Conjunctivae are normal. Left eye exhibits no discharge. No scleral icterus.  Neck: Normal range of motion. Neck supple. No JVD present. No thyromegaly present.  Cardiovascular: Normal rate, regular rhythm, S1 normal and S2 normal. Exam reveals no gallop, no S3, no S4 and no friction rub.  Murmur heard.  Systolic murmur is present with a grade of 1/6.  No diastolic murmur is present. Pulmonary/Chest: Effort normal  and breath sounds normal. No respiratory distress. She has no wheezes. She has no rales.  Abdominal: Soft. Bowel sounds are normal. She exhibits no distension and no mass. There is no tenderness.  Musculoskeletal: Normal range of motion. She exhibits no edema or tenderness.  Lymphadenopathy:    She has no cervical adenopathy.  Neurological: She is alert and oriented to person, place, and time.  Skin: Skin is warm and dry. No rash noted. She is not diaphoretic. No erythema. No pallor.    Lab Results  Component Value Date   WBC 5.4  07/01/2017   HGB 12.5 07/01/2017   HCT 38.5 07/01/2017   PLT 286.0 07/01/2017   GLUCOSE 105 (H) 10/27/2017   CHOL 213 (H) 07/01/2017   TRIG 221.0 (H) 07/01/2017   HDL 53.80 07/01/2017   LDLDIRECT 104.0 07/01/2017   NA 137 10/27/2017   K 4.3 10/27/2017   CL 101 10/27/2017   CREATININE 1.57 (H) 10/27/2017   BUN 31 (H) 10/27/2017   CO2 28 10/27/2017   TSH 5.27 (H) 10/27/2017   INR 1.00 08/23/2015    Mm Screening Breast Tomo Bilateral  Result Date: 09/17/2017 CLINICAL DATA:  Screening. EXAM: 2D DIGITAL SCREENING BILATERAL MAMMOGRAM WITH 3D TOMO WITH CAD COMPARISON:  Previous exam(s). ACR Breast Density Category b: There are scattered areas of fibroglandular density. FINDINGS: There are no findings suspicious for malignancy. Images were processed with CAD. IMPRESSION: No mammographic evidence of malignancy. A result letter of this screening mammogram will be mailed directly to the patient. RECOMMENDATION: Screening mammogram in one year. (Code:SM-B-01Y) BI-RADS CATEGORY  1: Negative. Electronically Signed   By: Evangeline Dakin M.D.   On: 09/17/2017 11:12    Assessment & Plan:   [name] was seen today for hypothyroidism and hypertension.  Diagnoses and all orders for this visit:  Subclinical hypothyroidism- Her TSH is very mildly elevated but stable over time.  Her other TFTs are normal.  She is asymptomatic so at this time I do not recommend starting thyroid replacement therapy. -     Thyroid Panel With TSH; Future  Chronic kidney disease (CKD), stage III (moderate) ([place])- Her kidney function is stable.  She agrees to avoid nephrotoxic agents. 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 -     Uric acid; Future  Diastolic congestive heart failure, unspecified HF chronicity ([place])- She has a normal volume status today.  Will continue strict  control of her blood pressure.  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  Her calcium level is in the upper normal range.  I have asked her to return for me to recheck her vitamin D level. -     PTH, intact and calcium; Future  Renovascular hypertension- Her blood pressure is adequately well controlled.  Will change Bystolic to a generic. -     carvedilol (COREG) 6.25 MG tablet; Take 1 tablet (6.25 mg total) by mouth 2 (two) times daily with a meal.   I have discontinued [name]'s nebivolol. I am also having her start on carvedilol. Additionally, I am having her maintain her atorvastatin, diltiazem, Cholecalciferol, febuxostat, docusate sodium, furosemide, clindamycin, triamcinolone cream, and predniSONE. We will stop administering sodium chloride.  Meds ordered this encounter  Medications  . carvedilol (COREG) 6.25 MG tablet  Sig: Take 1 tablet (6.25 mg total) by mouth 2 (two) times daily with a meal.    Dispense:  180 tablet    Refill:  1     Follow-up: Return in about 6 months (around 04/26/2018).  Scarlette Calico, MD

## 2017-10-28 ENCOUNTER — Encounter: Payer: Self-pay | Admitting: Internal Medicine

## 2017-10-28 ENCOUNTER — Encounter: Payer: Self-pay | Admitting: Emergency Medicine

## 2017-10-28 LAB — THYROID PANEL WITH TSH
Free Thyroxine Index: 2.6 (ref 1.4–3.8)
T3 Uptake: 29 % (ref 22–35)
T4, Total: 8.9 ug/dL (ref 5.1–11.9)
TSH: 5.27 mIU/L — ABNORMAL HIGH (ref 0.40–4.50)

## 2017-10-28 LAB — PTH, INTACT AND CALCIUM
Calcium: 10.1 mg/dL (ref 8.6–10.4)
PTH: 181 pg/mL — AB (ref 14–64)

## 2017-10-29 DIAGNOSIS — L309 Dermatitis, unspecified: Secondary | ICD-10-CM | POA: Diagnosis not present

## 2017-10-29 DIAGNOSIS — L821 Other seborrheic keratosis: Secondary | ICD-10-CM | POA: Diagnosis not present

## 2017-10-29 DIAGNOSIS — Z23 Encounter for immunization: Secondary | ICD-10-CM | POA: Diagnosis not present

## 2017-10-29 DIAGNOSIS — D225 Melanocytic nevi of trunk: Secondary | ICD-10-CM | POA: Diagnosis not present

## 2017-10-29 DIAGNOSIS — L814 Other melanin hyperpigmentation: Secondary | ICD-10-CM | POA: Diagnosis not present

## 2017-10-29 DIAGNOSIS — D1801 Hemangioma of skin and subcutaneous tissue: Secondary | ICD-10-CM | POA: Diagnosis not present

## 2017-11-03 DIAGNOSIS — H2511 Age-related nuclear cataract, right eye: Secondary | ICD-10-CM | POA: Diagnosis not present

## 2017-11-12 DIAGNOSIS — H25811 Combined forms of age-related cataract, right eye: Secondary | ICD-10-CM | POA: Diagnosis not present

## 2017-11-12 DIAGNOSIS — H2511 Age-related nuclear cataract, right eye: Secondary | ICD-10-CM | POA: Diagnosis not present

## 2017-11-30 ENCOUNTER — Encounter: Payer: Self-pay | Admitting: Internal Medicine

## 2017-12-04 ENCOUNTER — Ambulatory Visit: Payer: BLUE CROSS/BLUE SHIELD | Admitting: Family Medicine

## 2017-12-11 ENCOUNTER — Encounter: Payer: Self-pay | Admitting: Family

## 2017-12-11 ENCOUNTER — Ambulatory Visit (INDEPENDENT_AMBULATORY_CARE_PROVIDER_SITE_OTHER): Payer: Medicare Other | Admitting: Family

## 2017-12-11 VITALS — BP 138/82 | HR 78 | Temp 98.8°F | Ht 65.0 in | Wt 211.0 lb

## 2017-12-11 DIAGNOSIS — H6983 Other specified disorders of Eustachian tube, bilateral: Secondary | ICD-10-CM

## 2017-12-11 DIAGNOSIS — J309 Allergic rhinitis, unspecified: Secondary | ICD-10-CM

## 2017-12-11 MED ORDER — FLUTICASONE PROPIONATE 50 MCG/ACT NA SUSP: 2.0000 | Freq: Every day | NASAL | 6 refills | Status: DC

## 2017-12-11 NOTE — Patient Instructions (Signed)
Eustachian Tube Dysfunction The eustachian tube connects the middle ear to the back of the nose. It regulates air pressure in the middle ear by allowing air to move between the ear and nose. It also helps to drain fluid from the middle ear space. When the eustachian tube does not function properly, air pressure, fluid, or both can build up in the middle ear. Eustachian tube dysfunction can affect one or both ears. What are the causes? This condition happens when the eustachian tube becomes blocked or cannot open normally. This may result from:  Ear infections.  Colds and other upper respiratory infections.  Allergies.  Irritation, such as from cigarette smoke or acid from the stomach coming up into the esophagus (gastroesophageal reflux).  Sudden changes in air pressure, such as from descending in an airplane.  Abnormal growths in the nose or throat, such as nasal polyps, tumors, or enlarged tissue at the back of the throat (adenoids).  What increases the risk? This condition may be more likely to develop in people who smoke and people who are overweight. Eustachian tube dysfunction may also be more likely to develop in children, especially children who have:  Certain birth defects of the mouth, such as cleft palate.  Large tonsils and adenoids.  What are the signs or symptoms? Symptoms of this condition may include:  A feeling of fullness in the ear.  Ear pain.  Clicking or popping noises in the ear.  Ringing in the ear.  Hearing loss.  Loss of balance.  Symptoms may get worse when the air pressure around you changes, such as when you travel to an area of high elevation or fly on an airplane. How is this diagnosed? This condition may be diagnosed based on:  Your symptoms.  A physical exam of your ear, nose, and throat.  Tests, such as those that measure: ? The movement of your eardrum (tympanogram). ? Your hearing (audiometry).  How is this treated? Treatment  depends on the cause and severity of your condition. If your symptoms are mild, you may be able to relieve your symptoms by moving air into ("popping") your ears. If you have symptoms of fluid in your ears, treatment may include:  Decongestants.  Antihistamines.  Nasal sprays or ear drops that contain medicines that reduce swelling (steroids).  In some cases, you may need to have a procedure to drain the fluid in your eardrum (myringotomy). In this procedure, a small tube is placed in the eardrum to:  Drain the fluid.  Restore the air in the middle ear space.  Follow these instructions at home:  Take over-the-counter and prescription medicines only as told by your health care provider.  Use techniques to help pop your ears as recommended by your health care provider. These may include: ? Chewing gum. ? Yawning. ? Frequent, forceful swallowing. ? Closing your mouth, holding your nose closed, and gently blowing as if you are trying to blow air out of your nose.  Do not do any of the following until your health care provider approves: ? Travel to high altitudes. ? Fly in airplanes. ? Work in a pressurized cabin or room. ? Scuba dive.  Keep your ears dry. Dry your ears completely after showering or bathing.  Do not smoke.  Keep all follow-up visits as told by your health care provider. This is important. Contact a health care provider if:  Your symptoms do not go away after treatment.  Your symptoms come back after treatment.  You are   unable to pop your ears.  You have: ? A fever. ? Pain in your ear. ? Pain in your head or neck. ? Fluid draining from your ear.  Your hearing suddenly changes.  You become very dizzy.  You lose your balance. This information is not intended to replace advice given to you by your health care provider. Make sure you discuss any questions you have with your health care provider. Document Released: 09/28/2015 Document Revised: 02/07/2016  Document Reviewed: 09/20/2014 [employer] Interactive Patient Education  2018 [employer]  

## 2017-12-11 NOTE — Progress Notes (Signed)
[name] is a 67 y.o. female with the following history as recorded in EpicCare:  Patient Active Problem List   Diagnosis Date Noted  . Hyperparathyroidism due to vitamin D deficiency ([place]) 07/01/2017  . Sleep apnea 07/01/2017  . Gout due to renal impairment involving toe of right foot 07/01/2017  . Ventral hernia without obstruction or gangrene 08/23/2015  . Genetic testing 08/17/2015  . Asthma, mild intermittent 06/20/2015  . History of DVT (deep vein thrombosis) 05/22/2015  . Pulmonary nodule 05/18/2015  . Cecal cancer ([place]) 03/13/2014  . Diastolic congestive heart failure ([place]) 02/18/2014  . Nonrheumatic aortic valve disorder 02/06/2014  . Dyslipidemia, goal LDL below 130   . Subclinical hypothyroidism 07/01/2013  . Endometrial cancer ([place]) 06/29/2013  . Chronic kidney disease (CKD), stage III (moderate) ([place]) 05/31/2013  . H/O Guillain-Barre syndrome 05/31/2013  . Renovascular hypertension 05/31/2013    Current Outpatient Medications  Medication Sig Dispense Refill  . allopurinol (ZYLOPRIM) 100 MG tablet Take 100 mg by mouth daily.    Marland Kitchen atorvastatin (LIPITOR) 10 MG tablet Take 10 mg by mouth at bedtime.     . carvedilol (COREG) 6.25 MG tablet Take 1 tablet (6.25 mg total) by mouth 2 (two) times daily with a meal. 180 tablet 1  . Cholecalciferol 2000 units TABS Take 1 tablet (2,000 Units total) by mouth daily. 90 tablet 1  . clindamycin (CLEOCIN) 150 MG capsule Take 150 mg by mouth as needed. As needed for Dental Procedures    . diltiazem (CARDIZEM CD) 240 MG 24 hr capsule Take 240 mg by mouth daily before breakfast.     . docusate sodium (COLACE) 100 MG capsule Take 100 mg by mouth at bedtime.    . furosemide (LASIX) 40 MG tablet Take 40 mg by mouth daily.    Marland Kitchen triamcinolone cream (KENALOG) 0.1 % Apply 1 application topically 2 (two) times daily. 30 g 0  . fluticasone (FLONASE) 50 MCG/ACT nasal spray Place 2 sprays into both nostrils daily. 16 g 6   No current  facility-administered medications for this visit.     Allergies: Penicillins; Prednisone; and Sulfa antibiotics  Past Medical History:  Diagnosis Date  . Anemia    Unclear etiology  . Arthritis   . Asthma in adult    seasonal mild  (allergies) , intermittent as a child, no inhaler  . Cancer [facility])    endometrial- tx/w hysterectomy  . CKD (chronic kidney disease), stage III (HCC)    GFR 30-59  . Colon cancer ([place]) 02/22/2014  . Colon polyps 12/2002  . GERD (gastroesophageal reflux disease)    sick in May 2015, multiple complaints, but one being "pushing up sensation in her chest, abdomen region", followed by cardiac at first for clearance for colonscopy- started on Prilosec temporarily, but hasn't taken in 2 weeks    . H/O Guillain-Barre syndrome    mid-late 1980's  . DDUKGURK(270.6)    last difficult headache - early June - 2015  . Heart murmur   . History of endometrial cancer   . HLD (hyperlipidemia)   . Hypertension    recent cardiac consult - 01/2014 done for compalints relative to nausea & breathing changes, clearance for colonoscopy   . Hypothyroidism    subclinical  . Peripheral vascular disease ([place])    dvt 's  . Post-streptococcal glomerulonephritis   . Sleep apnea 07/01/2017  . TMJ (dislocation of temporomandibular joint)    no problem anymore    Past Surgical History:  Procedure  Laterality Date  . 22 Hr Holter Monitor  01/2014   NSR with intermittent PVCs; no arrhythmia  . ABDOMINAL HYSTERECTOMY    . COLONOSCOPY    . COLONOSCOPY W/ POLYPECTOMY    . EXPLORATORY LAPAROTOMY    . HIP ARTHROPLASTY     right  . INSERTION OF MESH N/A 08/23/2015   Procedure: INSERTION OF MESH;  Surgeon: Autumn Messing III, MD;  Location: [place];  Service: General;  Laterality: N/A;  . KNEE ARTHROSCOPY Right   . LAPAROSCOPIC RIGHT COLECTOMY Right 04/10/2014   Procedure: LAPAROSCOPIC ASSISTED  RIGHT COLECTOMY;  Surgeon: Merrie Roof, MD;  Location: [place];  Service: General;  Laterality:  Right;  . LYMPHADENECTOMY     para aortic and pelvic  . NM MYOVIEW LTD  02/03/2014   LOW RISK: Exercise 6:51 / 7 METS; + EKG for Ischemia, No CP; Images negative for ischemia or infarction  . RENAL BIOPSY    . RIGHT COLECTOMY  04/10/2014   DR TOTH  . TONSILLECTOMY AND ADENOIDECTOMY    . TOTAL ABDOMINAL HYSTERECTOMY W/ BILATERAL SALPINGOOPHORECTOMY    . TRANSTHORACIC ECHOCARDIOGRAM  02/03/2014   EF 55-60%. No regional WMA, Gr 3 DD (High filling pressures); Mod LA dilation  . UPPER GASTROINTESTINAL ENDOSCOPY    . VENTRAL HERNIA REPAIR N/A 08/23/2015   Procedure: LAPAROSCOPIC ASSISTED VENTRAL HERNIA REPAIR WITH MESH;  Surgeon: Autumn Messing III, MD;  Location: [place];  Service: General;  Laterality: N/A;    Family History  Problem Relation Age of Onset  . Colon cancer Father 55  . Valvular heart disease Father   . Colon polyps Father   . Colon cancer Sister 83  . Diabetes Mother   . Pancreatic cancer Maternal Grandmother 73  . Colon cancer Maternal Grandfather 67  . Breast cancer Paternal Aunt 36  . Colon cancer Cousin 74       pat female first cousin  . Colonic polyp Sister   . Diabetes Sister   . Esophageal cancer Neg Hx   . Rectal cancer Neg Hx   . Stomach cancer Neg Hx     Social History   Tobacco Use  . Smoking status: Never Smoker  . Smokeless tobacco: Never Used  Substance Use Topics  . Alcohol use: No    Frequency: Never    Subjective:  Left ear pain "on and off" x months; not a constant ear pain; no dental issues being treated; feels an occasional popping sensation; worried because she is getting on a plane next week; does have seasonal allergies- does not take anything regularly for symptoms.   Objective:  Vitals:   12/11/17 1140  BP: 138/82  Pulse: 78  Temp: 98.8 F (37.1 C)  TempSrc: Oral  SpO2: 97%  Weight: 211 lb (95.7 kg)  Height: 5\' 5"  (1.651 m)    General: Well developed, well nourished, in no acute distress  Skin : Warm and dry.  Head: Normocephalic  and atraumatic  Eyes: Sclera and conjunctiva clear; pupils round and reactive to light; extraocular movements intact  Ears: External normal; canals clear; tympanic membranes congested Oropharynx: Pink, supple. No suspicious lesions  Neck: Supple without thyromegaly, adenopathy  Lungs: Respirations unlabored; clear to auscultation bilaterally without wheeze, rales, rhonchi  CVS exam: normal rate and regular rhythm.  Neurologic: Alert and oriented; speech intact; face symmetrical; moves all extremities well; CNII-XII intact without focal deficit   Assessment:  1. Dysfunction of both eustachian tubes   2. Allergic rhinitis, unspecified  seasonality, unspecified trigger     Plan:  Reassurance; trial of Flonase NS; increase fluids, rest and follow-up worse, no better.   No follow-ups on file.  No orders of the defined types were placed in this encounter.   Requested Prescriptions   Signed Prescriptions Disp Refills  . fluticasone (FLONASE) 50 MCG/ACT nasal spray 16 g 6    Sig: Place 2 sprays into both nostrils daily.

## 2017-12-12 ENCOUNTER — Other Ambulatory Visit: Payer: Self-pay | Admitting: Internal Medicine

## 2017-12-12 DIAGNOSIS — M1A3710 Chronic gout due to renal impairment, right ankle and foot, without tophus (tophi): Secondary | ICD-10-CM

## 2018-01-05 DIAGNOSIS — N183 Chronic kidney disease, stage 3 (moderate): Secondary | ICD-10-CM | POA: Diagnosis not present

## 2018-01-05 DIAGNOSIS — M109 Gout, unspecified: Secondary | ICD-10-CM | POA: Diagnosis not present

## 2018-01-05 DIAGNOSIS — I129 Hypertensive chronic kidney disease with stage 1 through stage 4 chronic kidney disease, or unspecified chronic kidney disease: Secondary | ICD-10-CM | POA: Diagnosis not present

## 2018-01-05 DIAGNOSIS — N2581 Secondary hyperparathyroidism of renal origin: Secondary | ICD-10-CM | POA: Diagnosis not present

## 2018-01-05 DIAGNOSIS — C189 Malignant neoplasm of colon, unspecified: Secondary | ICD-10-CM | POA: Diagnosis not present

## 2018-01-05 DIAGNOSIS — E785 Hyperlipidemia, unspecified: Secondary | ICD-10-CM | POA: Diagnosis not present

## 2018-01-27 ENCOUNTER — Other Ambulatory Visit: Payer: Self-pay | Admitting: Nephrology

## 2018-01-27 DIAGNOSIS — M8580 Other specified disorders of bone density and structure, unspecified site: Secondary | ICD-10-CM

## 2018-01-28 ENCOUNTER — Ambulatory Visit
Admission: RE | Admit: 2018-01-28 | Discharge: 2018-01-28 | Disposition: A | Discharge status: Home / Self Care | Payer: Medicare Other | Source: Ambulatory Visit | Attending: Nephrology | Admitting: Nephrology

## 2018-01-28 DIAGNOSIS — M8580 Other specified disorders of bone density and structure, unspecified site: Secondary | ICD-10-CM

## 2018-01-28 DIAGNOSIS — Z780 Asymptomatic menopausal state: Secondary | ICD-10-CM | POA: Diagnosis not present

## 2018-01-28 DIAGNOSIS — Z13820 Encounter for screening for osteoporosis: Secondary | ICD-10-CM | POA: Diagnosis not present

## 2018-01-28 LAB — HM DEXA SCAN

## 2018-01-30 LAB — HM DEXA SCAN: HM DEXA SCAN: -0.9

## 2018-02-04 ENCOUNTER — Encounter: Payer: Self-pay | Admitting: Gastroenterology

## 2018-02-10 ENCOUNTER — Encounter: Payer: Self-pay | Admitting: Gastroenterology

## 2018-02-12 ENCOUNTER — Encounter: Payer: Self-pay | Admitting: Internal Medicine

## 2018-02-22 ENCOUNTER — Other Ambulatory Visit: Payer: Self-pay | Admitting: Internal Medicine

## 2018-02-22 ENCOUNTER — Telehealth: Payer: Self-pay | Admitting: Internal Medicine

## 2018-02-22 DIAGNOSIS — I150 Renovascular hypertension: Secondary | ICD-10-CM

## 2018-02-22 MED ORDER — NEBIVOLOL HCL 5 MG PO TABS: 5.0000 mg | ORAL_TABLET | Freq: Every day | ORAL | 0 refills | Status: DC

## 2018-02-22 NOTE — Telephone Encounter (Signed)
Copied from [place] [id number]. Topic: Quick Communication - See Telephone Encounter >> Feb 22, 2018  8:28 AM Nils Flack wrote: CRM for notification. See Telephone encounter for: 02/22/18. Pt was put on carvedilol (COREG) 6.25 MG tablet - it is not helping. Bp is running average 150's /90's.  It is also making pt feel funny.  Pt want to know if it ok to go back to bystolic.  Pt is also on Calciet for parathyroid. Cb is [phone] Sherian Rein is [facility] [employer] [address]. Pt has bystolic at home.

## 2018-02-22 NOTE — Telephone Encounter (Signed)
RX for bystolic sent Please have her come in soon for a BP check

## 2018-02-22 NOTE — Telephone Encounter (Signed)
Pt is asking to change the carvedilol to bystolic due to side effects and ineffectiveness. Please advise

## 2018-02-24 NOTE — Telephone Encounter (Signed)
Left detailed message left informing pt rx has been sent (Bystolic) and to schedule an appointment at first convenient time for pt and provider.

## 2018-03-08 ENCOUNTER — Ambulatory Visit: Payer: Medicare Other

## 2018-03-08 ENCOUNTER — Telehealth: Payer: Self-pay

## 2018-03-08 NOTE — Telephone Encounter (Signed)
Patient came in today (nurse visit) to have bp rechecked after re-starting bystolic about 2 weeks ago---bp reading today was 142/82---do you want to make any further changes to bp medications--please advise, I will call patient back, thanks

## 2018-03-22 ENCOUNTER — Other Ambulatory Visit (INDEPENDENT_AMBULATORY_CARE_PROVIDER_SITE_OTHER): Payer: Medicare Other

## 2018-03-22 ENCOUNTER — Ambulatory Visit (INDEPENDENT_AMBULATORY_CARE_PROVIDER_SITE_OTHER): Payer: Medicare Other | Admitting: Internal Medicine

## 2018-03-22 ENCOUNTER — Encounter: Payer: Self-pay | Admitting: Internal Medicine

## 2018-03-22 VITALS — BP 148/90 | HR 76 | Temp 97.9°F | Resp 16 | Ht 65.0 in | Wt 213.0 lb

## 2018-03-22 DIAGNOSIS — E211 Secondary hyperparathyroidism, not elsewhere classified: Secondary | ICD-10-CM

## 2018-03-22 DIAGNOSIS — D508 Other iron deficiency anemias: Secondary | ICD-10-CM

## 2018-03-22 DIAGNOSIS — H6982 Other specified disorders of Eustachian tube, left ear: Secondary | ICD-10-CM | POA: Diagnosis not present

## 2018-03-22 DIAGNOSIS — E038 Other specified hypothyroidism: Secondary | ICD-10-CM

## 2018-03-22 DIAGNOSIS — K30 Functional dyspepsia: Secondary | ICD-10-CM | POA: Diagnosis not present

## 2018-03-22 DIAGNOSIS — E039 Hypothyroidism, unspecified: Secondary | ICD-10-CM | POA: Diagnosis not present

## 2018-03-22 DIAGNOSIS — H6992 Unspecified Eustachian tube disorder, left ear: Secondary | ICD-10-CM | POA: Insufficient documentation

## 2018-03-22 LAB — CBC WITH DIFFERENTIAL/PLATELET
BASOS ABS: 0.1 10*3/uL (ref 0.0–0.1)
Basophils Relative: 0.9 % (ref 0.0–3.0)
EOS ABS: 0.2 10*3/uL (ref 0.0–0.7)
Eosinophils Relative: 2.4 % (ref 0.0–5.0)
HCT: 35.7 % — ABNORMAL LOW (ref 36.0–46.0)
Hemoglobin: 11.9 g/dL — ABNORMAL LOW (ref 12.0–15.0)
Lymphocytes Relative: 26.8 % (ref 12.0–46.0)
Lymphs Abs: 1.9 10*3/uL (ref 0.7–4.0)
MCHC: 33.2 g/dL (ref 30.0–36.0)
MCV: 85.4 fl (ref 78.0–100.0)
MONO ABS: 0.5 10*3/uL (ref 0.1–1.0)
Monocytes Relative: 7.4 % (ref 3.0–12.0)
NEUTROS ABS: 4.4 10*3/uL (ref 1.4–7.7)
NEUTROS PCT: 62.5 % (ref 43.0–77.0)
Platelets: 281 10*3/uL (ref 150.0–400.0)
RBC: 4.19 Mil/uL (ref 3.87–5.11)
RDW: 15 % (ref 11.5–15.5)
WBC: 7 10*3/uL (ref 4.0–10.5)

## 2018-03-22 LAB — BASIC METABOLIC PANEL
BUN: 36 mg/dL — ABNORMAL HIGH (ref 6–23)
CALCIUM: 8.8 mg/dL (ref 8.4–10.5)
CHLORIDE: 103 meq/L (ref 96–112)
CO2: 28 mEq/L (ref 19–32)
Creatinine, Ser: 1.65 mg/dL — ABNORMAL HIGH (ref 0.40–1.20)
GFR: 33.14 mL/min — ABNORMAL LOW (ref >60.00)
Glucose, Bld: 93 mg/dL (ref 70–99)
POTASSIUM: 4.2 meq/L (ref 3.5–5.1)
SODIUM: 139 meq/L (ref 135–145)

## 2018-03-22 LAB — VITAMIN D 25 HYDROXY (VIT D DEFICIENCY, FRACTURES): VITD: 33.84 ng/mL (ref 30.00–100.00)

## 2018-03-22 LAB — IBC PANEL
Iron: 63 ug/dL (ref 42–145)
Saturation Ratios: 15.2 % — ABNORMAL LOW (ref 20.0–50.0)
Transferrin: 296 mg/dL (ref 212.0–360.0)

## 2018-03-22 LAB — FERRITIN: Ferritin: 80.7 ng/mL (ref 10.0–291.0)

## 2018-03-22 LAB — TSH: TSH: 5.25 u[IU]/mL — ABNORMAL HIGH (ref 0.35–4.50)

## 2018-03-22 MED ORDER — CETIRIZINE HCL 10 MG PO TABS: 10.0000 mg | ORAL_TABLET | Freq: Every day | ORAL | 1 refills | Status: DC

## 2018-03-22 MED ORDER — ALUM HYDROXIDE-MAG CARBONATE 160-105 MG PO CHEW: 1.0000 | CHEWABLE_TABLET | Freq: Three times a day (TID) | ORAL | 2 refills | Status: DC | PRN

## 2018-03-22 MED ORDER — FLUTICASONE PROPIONATE 50 MCG/ACT NA SUSP: 2.0000 | Freq: Every day | NASAL | 6 refills | Status: DC

## 2018-03-22 NOTE — Patient Instructions (Signed)
Eustachian Tube Dysfunction The eustachian tube connects the middle ear to the back of the nose. It regulates air pressure in the middle ear by allowing air to move between the ear and nose. It also helps to drain fluid from the middle ear space. When the eustachian tube does not function properly, air pressure, fluid, or both can build up in the middle ear. Eustachian tube dysfunction can affect one or both ears. What are the causes? This condition happens when the eustachian tube becomes blocked or cannot open normally. This may result from:  Ear infections.  Colds and other upper respiratory infections.  Allergies.  Irritation, such as from cigarette smoke or acid from the stomach coming up into the esophagus (gastroesophageal reflux).  Sudden changes in air pressure, such as from descending in an airplane.  Abnormal growths in the nose or throat, such as nasal polyps, tumors, or enlarged tissue at the back of the throat (adenoids).  What increases the risk? This condition may be more likely to develop in people who smoke and people who are overweight. Eustachian tube dysfunction may also be more likely to develop in children, especially children who have:  Certain birth defects of the mouth, such as cleft palate.  Large tonsils and adenoids.  What are the signs or symptoms? Symptoms of this condition may include:  A feeling of fullness in the ear.  Ear pain.  Clicking or popping noises in the ear.  Ringing in the ear.  Hearing loss.  Loss of balance.  Symptoms may get worse when the air pressure around you changes, such as when you travel to an area of high elevation or fly on an airplane. How is this diagnosed? This condition may be diagnosed based on:  Your symptoms.  A physical exam of your ear, nose, and throat.  Tests, such as those that measure: ? The movement of your eardrum (tympanogram). ? Your hearing (audiometry).  How is this treated? Treatment  depends on the cause and severity of your condition. If your symptoms are mild, you may be able to relieve your symptoms by moving air into ("popping") your ears. If you have symptoms of fluid in your ears, treatment may include:  Decongestants.  Antihistamines.  Nasal sprays or ear drops that contain medicines that reduce swelling (steroids).  In some cases, you may need to have a procedure to drain the fluid in your eardrum (myringotomy). In this procedure, a small tube is placed in the eardrum to:  Drain the fluid.  Restore the air in the middle ear space.  Follow these instructions at home:  Take over-the-counter and prescription medicines only as told by your health care provider.  Use techniques to help pop your ears as recommended by your health care provider. These may include: ? Chewing gum. ? Yawning. ? Frequent, forceful swallowing. ? Closing your mouth, holding your nose closed, and gently blowing as if you are trying to blow air out of your nose.  Do not do any of the following until your health care provider approves: ? Travel to high altitudes. ? Fly in airplanes. ? Work in a pressurized cabin or room. ? Scuba dive.  Keep your ears dry. Dry your ears completely after showering or bathing.  Do not smoke.  Keep all follow-up visits as told by your health care provider. This is important. Contact a health care provider if:  Your symptoms do not go away after treatment.  Your symptoms come back after treatment.  You are   unable to pop your ears.  You have: ? A fever. ? Pain in your ear. ? Pain in your head or neck. ? Fluid draining from your ear.  Your hearing suddenly changes.  You become very dizzy.  You lose your balance. This information is not intended to replace advice given to you by your health care provider. Make sure you discuss any questions you have with your health care provider. Document Released: 09/28/2015 Document Revised: 02/07/2016  Document Reviewed: 09/20/2014 [employer] Interactive Patient Education  2018 [employer]  

## 2018-03-22 NOTE — Progress Notes (Signed)
Subjective:  Patient ID: [name], female    DOB: [birth year]  Age: 67 y.o. MRN: [id number]  CC: Hypertension and Ear Fullness   HPI [name] presents for f/up - she complains of a one-month history of fullness, popping, and pressure in her left ear.  She also complains of nasal congestion and runny nose.  She also complains of last few months of a few episodes of burping, belching, and a burning sensation in her epigastric region.  She denies odynophagia, dysphagia, loss of appetite, or weight loss.  She is not taking anything to control the symptoms.  She is being followed for renal insufficiency and iron deficiency anemia.  She tells me she has not recently been taking an iron supplement.  Outpatient Medications Prior to Visit  Medication Sig Dispense Refill  . allopurinol (ZYLOPRIM) 100 MG tablet TAKE 1 TABLET(100 MG) BY MOUTH DAILY 90 tablet 0  . atorvastatin (LIPITOR) 10 MG tablet Take 10 mg by mouth at bedtime.     . Biotin 1000 MCG tablet Take 1,000 mcg by mouth 3 (three) times daily. Patient reported taking 1250mg  tablet daily    . Cholecalciferol 2000 units TABS Take 1 tablet (2,000 Units total) by mouth daily. 90 tablet 1  . cinacalcet (SENSIPAR) 30 MG tablet Take 30 mg by mouth daily.    . clindamycin (CLEOCIN) 150 MG capsule Take 150 mg by mouth as needed. As needed for Dental Procedures    . diltiazem (CARDIZEM CD) 240 MG 24 hr capsule Take 240 mg by mouth daily before breakfast.     . docusate sodium (COLACE) 100 MG capsule Take 100 mg by mouth at bedtime.    . furosemide (LASIX) 40 MG tablet Take 40 mg by mouth daily.    . nebivolol (BYSTOLIC) 5 MG tablet Take 1 tablet (5 mg total) by mouth daily. 90 tablet 0  . triamcinolone cream (KENALOG) 0.1 % Apply 1 application topically 2 (two) times daily. 30 g 0  . fluticasone (FLONASE) 50 MCG/ACT nasal spray Place 2 sprays into both nostrils daily. 16 g 6  . allopurinol (ZYLOPRIM) 100 MG tablet Take 100 mg by  mouth daily.     No facility-administered medications prior to visit.     ROS Review of Systems  Constitutional: Negative for appetite change, fatigue and unexpected weight change.  HENT: Positive for congestion, ear pain, postnasal drip and rhinorrhea. Negative for facial swelling, hearing loss, nosebleeds, sinus pressure, sore throat and trouble swallowing.   Respiratory: Negative.  Negative for cough, chest tightness, shortness of breath and wheezing.   Cardiovascular: Negative for chest pain, palpitations and leg swelling.  Gastrointestinal: Negative for abdominal pain, constipation, diarrhea, nausea and vomiting.  Endocrine: Negative for cold intolerance and heat intolerance.  Genitourinary: Negative.  Negative for decreased urine volume, difficulty urinating, dysuria, hematuria and urgency.  Musculoskeletal: Negative.  Negative for arthralgias and myalgias.  Skin: Negative.  Negative for color change and rash.  Neurological: Negative.  Negative for dizziness, weakness, light-headedness, numbness and headaches.  Hematological: Negative for adenopathy. Does not bruise/bleed easily.  Psychiatric/Behavioral: Negative.     Objective:  BP (!) 148/90 (BP Location: Left Arm, Patient Position: Sitting, Cuff Size: Large)   Pulse 76   Temp 97.9 F (36.6 C) (Oral)   Resp 16   Ht 5\' 5"  (1.651 m)   Wt 213 lb (96.6 kg)   SpO2 96%   BMI 35.45 kg/m   BP Readings from Last 3 Encounters:  03/22/18 Marland Kitchen)  148/90  03/08/18 (!) 142/82  12/11/17 138/82    Wt Readings from Last 3 Encounters:  03/22/18 213 lb (96.6 kg)  12/11/17 211 lb (95.7 kg)  10/27/17 210 lb (95.3 kg)    Physical Exam  Constitutional: She is oriented to person, place, and time. No distress.  HENT:  Right Ear: Hearing, tympanic membrane, external ear and ear canal normal.  Left Ear: Hearing, tympanic membrane, external ear and ear canal normal. No drainage, swelling or tenderness. No foreign bodies. No mastoid  tenderness. Tympanic membrane is not injected, not scarred, not perforated, not erythematous, not retracted and not bulging. Tympanic membrane mobility is normal.  No middle ear effusion. No hemotympanum. No decreased hearing is noted.  Nose: Mucosal edema present. No rhinorrhea or sinus tenderness. No epistaxis. Right sinus exhibits no maxillary sinus tenderness and no frontal sinus tenderness. Left sinus exhibits no maxillary sinus tenderness and no frontal sinus tenderness.  Mouth/Throat: Oropharynx is clear and moist. No oropharyngeal exudate.  Eyes: Conjunctivae are normal. No scleral icterus.  Neck: Normal range of motion. Neck supple. No JVD present. No thyromegaly present.  Cardiovascular: Normal rate, regular rhythm and normal heart sounds.  No murmur heard. Pulmonary/Chest: Effort normal and breath sounds normal. No respiratory distress. She has no wheezes. She has no rales.  Abdominal: Soft. Normal appearance and bowel sounds are normal. She exhibits no mass. There is no hepatosplenomegaly. There is no tenderness. No hernia.  Musculoskeletal: Normal range of motion. She exhibits no edema, tenderness or deformity.  Lymphadenopathy:    She has no cervical adenopathy.  Neurological: She is alert and oriented to person, place, and time.  Skin: Skin is warm and dry. She is not diaphoretic. No pallor.  Vitals reviewed.   Lab Results  Component Value Date   WBC 7.0 03/22/2018   HGB 11.9 (L) 03/22/2018   HCT 35.7 (L) 03/22/2018   PLT 281.0 03/22/2018   GLUCOSE 93 03/22/2018   CHOL 213 (H) 07/01/2017   TRIG 221.0 (H) 07/01/2017   HDL 53.80 07/01/2017   LDLDIRECT 104.0 07/01/2017   NA 139 03/22/2018   K 4.2 03/22/2018   CL 103 03/22/2018   CREATININE 1.65 (H) 03/22/2018   BUN 36 (H) 03/22/2018   CO2 28 03/22/2018   TSH 5.25 (H) 03/22/2018   INR 1.00 08/23/2015    Dg Bone Density (dxa)  Result Date: 01/28/2018 EXAM: DUAL X-RAY ABSORPTIOMETRY (DXA) FOR BONE MINERAL DENSITY  IMPRESSION: Referring Physician:  Jamal Colon PATIENT: Name: Erin, Colon Patient ID: [id number] Birth Date: 1950/09/18 Height: 63.7 in. Sex: Female Measured: 01/28/2018 Weight: 209.6 lbs. Indications: Bilateral Ovariectomy (65.51), Caucasian, Estrogen Deficient, Family History of Osteoporosis, Hyperparathyroid (252.1), Hysterectomy, Postmenopausal, Right hip replacement Fractures: None Treatments: Vitamin D (E933.5) ASSESSMENT: The BMD measured at Femur Neck is 0.910 g/cm2 with a T-score of -0.9. This patient is considered NORMAL according to [facility]) criteria. Right femur was excluded due to surgical hardware. L-4 was excluded due to degenerative changes. L4 was excluded due to degenerative changes. Site Region Measured Date Measured Age YA T-score BMD Significant CHANGE Left Femur Neck 01/28/2018 65.3 -0.11 0.910 g/cm2 AP Spine L1-L3 01/28/2018 65.3 -0.3 1.139 g/cm2 Left Forearm Radius 33% 01/28/2018 65.3 0.0 0.890 g/cm2 World Health Organization [facility]) criteria for post-menopausal, Caucasian Women: Normal       T-score at or above -1 SD Osteopenia   T-score between -1 and -2.5 SD Osteoporosis T-score at or below -2.5 SD RECOMMENDATION: 1. All patients should optimize calcium  and vitamin D intake. 2. Consider FDA approved medical therapies in postmenopausal women and men aged 79 years and older, based on the following: a. A hip or vertebral (clinical or morphometric) fracture b. T- score < or = -2.5 at the femoral neck or spine after appropriate evaluation to exclude secondary causes c. Low bone mass (T-score between -1.0 and -2.5 at the femoral neck or spine) and a 10 year probability of a hip fracture > or = 3% or a 10 year probability of a major osteoporosis-related fracture > or = 20% based on the [place]-adapted WHO algorithm d. Clinician judgment and/or patient preferences may indicate treatment for people with 10-year fracture probabilities above or below these levels FOLLOW-UP:  People with diagnosed cases of osteoporosis or at high risk for fracture should have regular bone mineral density tests. For patients eligible for Medicare, routine testing is allowed once every 2 years. The testing frequency can be increased to one year for patients who have rapidly progressing disease, those who are receiving or discontinuing medical therapy to restore bone mass, or have additional risk factors. I have reviewed this report and agree with the above findings. Mark A. Thornton Papas, M.D. [facility] Radiology Electronically Signed   By: Lavonia Dana M.D.   On: 01/28/2018 15:13    Assessment & Plan:   Barri was seen today for hypertension and ear fullness.  Diagnoses and all orders for this visit:  Subclinical hypothyroidism-her TSH is very mildly elevated and clinically she appears euthyroid.  At this time I do not recommend thyroid replacement therapy. -     TSH; Future  Hyperparathyroidism due to vitamin D deficiency (HCC)-her calcium and vitamin D levels are in the normal range. -     Basic metabolic panel; Future -     VITAMIN D 25 Hydroxy (Vit-D Deficiency, Fractures); Future  Nonulcer dyspepsia-she is not willing to take a chronic medicine like an H2 blocker or PPI.  I have encouraged her to use Gaviscon as needed.  She also has an upcoming appointment with GI within the next 6 weeks. -     Alum Hydroxide-Mag Carbonate (GAVISCON EXTRA STRENGTH) 160-105 MG CHEW; Chew 1 tablet by mouth 3 (three) times daily between meals as needed.  Iron deficiency anemia secondary to inadequate dietary iron intake- Her iron level is in the low normal range and she is mildly anemic, her saturation is low so I have asked her to be more compliant with the iron replacement therapy. -     IBC panel; Future -     CBC with Differential/Platelet; Future -     Ferritin; Future  Eustachian tube dysfunction, left- She is not willing to do a course of systemic steroids.  Decongestant therapy is  contraindicated due to the hypertension and renal insufficiency.  I have asked to be more compliant with the Flonase nasal spray and oral antihistamine. -     fluticasone (FLONASE) 50 MCG/ACT nasal spray; Place 2 sprays into both nostrils daily. -     cetirizine (ZYRTEC ALLERGY) 10 MG tablet; Take 1 tablet (10 mg total) by mouth at bedtime.   I am having [name] start on cetirizine and Alum Hydroxide-Mag Carbonate. I am also having her maintain her atorvastatin, diltiazem, Cholecalciferol, docusate sodium, furosemide, clindamycin, triamcinolone cream, allopurinol, nebivolol, Biotin, cinacalcet, and fluticasone.  Meds ordered this encounter  Medications  . fluticasone (FLONASE) 50 MCG/ACT nasal spray    Sig: Place 2 sprays into both nostrils daily.    Dispense:  16 g    Refill:  6  . cetirizine (ZYRTEC ALLERGY) 10 MG tablet    Sig: Take 1 tablet (10 mg total) by mouth at bedtime.    Dispense:  90 tablet    Refill:  1  . Alum Hydroxide-Mag Carbonate (GAVISCON EXTRA STRENGTH) 160-105 MG CHEW    Sig: Chew 1 tablet by mouth 3 (three) times daily between meals as needed.    Dispense:  90 tablet    Refill:  2     Follow-up: Return in about 6 weeks (around 05/03/2018).  Scarlette Calico, MD

## 2018-03-23 ENCOUNTER — Encounter: Payer: Self-pay | Admitting: Internal Medicine

## 2018-03-24 MED ORDER — FERROUS SULFATE 325 (65 FE) MG PO TABS: 325.0000 mg | ORAL_TABLET | Freq: Every day | ORAL | 1 refills | Status: DC

## 2018-04-15 ENCOUNTER — Ambulatory Visit (AMBULATORY_SURGERY_CENTER): Payer: Self-pay | Admitting: *Deleted

## 2018-04-15 ENCOUNTER — Telehealth: Payer: Self-pay | Admitting: *Deleted

## 2018-04-15 VITALS — Ht 64.0 in | Wt 216.4 lb

## 2018-04-15 DIAGNOSIS — Z85038 Personal history of other malignant neoplasm of large intestine: Secondary | ICD-10-CM

## 2018-04-15 DIAGNOSIS — Z1509 Genetic susceptibility to other malignant neoplasm: Secondary | ICD-10-CM

## 2018-04-15 MED ORDER — PEG 3350-KCL-NA BICARB-NACL 420 G PO SOLR: 4000.0000 mL | Freq: Once | ORAL | 0 refills | Status: AC

## 2018-04-15 NOTE — Telephone Encounter (Signed)
Dr. Ardis Hughs,  I saw this patient in pre-visit today. She explained that she has used Miralax in the past for her procedures and requested the same for her upcoming procedures. When I looked at her last set of instructions that had been given, it had indeed been changed to Miralax and your comment on the procedure report was that her results were excellent. I honored her wishes and changed her prep to Miralax as requested.  Just wanted to notify you.

## 2018-04-15 NOTE — Telephone Encounter (Signed)
Great, thanks

## 2018-04-15 NOTE — Progress Notes (Signed)
Denies allergies to eggs or soy products. Denies complications with sedation or anesthesia. Denies O2 use. Denies use of diet or weight loss medications.  Emmi instructions not given for colonoscopy, pt declined.  Pt requested Miralax prep as she had done last time and had excellent results with. Looked back to see if instructions had been resent and they had. Honored pt wishes by offering Miralax this time. Notified Dr. Ardis Hughs.

## 2018-05-07 ENCOUNTER — Encounter: Payer: Medicare Other | Admitting: Gastroenterology

## 2018-05-21 ENCOUNTER — Other Ambulatory Visit: Payer: Self-pay | Admitting: Internal Medicine

## 2018-05-21 DIAGNOSIS — M1A3710 Chronic gout due to renal impairment, right ankle and foot, without tophus (tophi): Secondary | ICD-10-CM

## 2018-05-26 DIAGNOSIS — R2689 Other abnormalities of gait and mobility: Secondary | ICD-10-CM | POA: Diagnosis not present

## 2018-05-26 DIAGNOSIS — M84374A Stress fracture, right foot, initial encounter for fracture: Secondary | ICD-10-CM | POA: Diagnosis not present

## 2018-05-26 DIAGNOSIS — R609 Edema, unspecified: Secondary | ICD-10-CM | POA: Diagnosis not present

## 2018-05-26 DIAGNOSIS — M79671 Pain in right foot: Secondary | ICD-10-CM | POA: Diagnosis not present

## 2018-06-01 ENCOUNTER — Other Ambulatory Visit: Payer: Self-pay | Admitting: Internal Medicine

## 2018-06-01 ENCOUNTER — Encounter: Payer: Self-pay | Admitting: Internal Medicine

## 2018-06-01 DIAGNOSIS — M109 Gout, unspecified: Secondary | ICD-10-CM | POA: Insufficient documentation

## 2018-06-01 DIAGNOSIS — M1000 Idiopathic gout, unspecified site: Secondary | ICD-10-CM

## 2018-06-01 MED ORDER — METHYLPREDNISOLONE 4 MG PO TBPK: ORAL_TABLET | ORAL | 0 refills | Status: AC

## 2018-06-16 DIAGNOSIS — M84374D Stress fracture, right foot, subsequent encounter for fracture with routine healing: Secondary | ICD-10-CM | POA: Diagnosis not present

## 2018-06-16 DIAGNOSIS — R609 Edema, unspecified: Secondary | ICD-10-CM | POA: Diagnosis not present

## 2018-06-16 DIAGNOSIS — M79671 Pain in right foot: Secondary | ICD-10-CM | POA: Diagnosis not present

## 2018-06-16 DIAGNOSIS — M84374A Stress fracture, right foot, initial encounter for fracture: Secondary | ICD-10-CM | POA: Diagnosis not present

## 2018-06-21 ENCOUNTER — Telehealth: Payer: Self-pay | Admitting: Gastroenterology

## 2018-06-21 NOTE — Telephone Encounter (Signed)
Spoke with pt regarding changing the bowel prep to Golytely. After instructing the pt on the [facility], the pt decided to stay with Miralax bowel prep. Reviewed the prep and answered questions. The pt will call back if she has further questions. [name]

## 2018-06-21 NOTE — Telephone Encounter (Signed)
Called pt back. No answer, left message for her to return my call about her prep.

## 2018-07-05 ENCOUNTER — Ambulatory Visit (INDEPENDENT_AMBULATORY_CARE_PROVIDER_SITE_OTHER): Payer: Medicare Other | Admitting: Internal Medicine

## 2018-07-05 ENCOUNTER — Encounter: Payer: Self-pay | Admitting: Internal Medicine

## 2018-07-05 ENCOUNTER — Ambulatory Visit: Payer: Medicare Other | Admitting: Internal Medicine

## 2018-07-05 ENCOUNTER — Ambulatory Visit: Payer: Medicare Other

## 2018-07-05 ENCOUNTER — Other Ambulatory Visit (INDEPENDENT_AMBULATORY_CARE_PROVIDER_SITE_OTHER): Payer: Medicare Other

## 2018-07-05 VITALS — BP 140/90 | HR 68 | Temp 98.1°F | Resp 16 | Ht 64.0 in | Wt 214.2 lb

## 2018-07-05 DIAGNOSIS — I150 Renovascular hypertension: Secondary | ICD-10-CM

## 2018-07-05 DIAGNOSIS — E785 Hyperlipidemia, unspecified: Secondary | ICD-10-CM | POA: Diagnosis not present

## 2018-07-05 DIAGNOSIS — N183 Chronic kidney disease, stage 3 unspecified: Secondary | ICD-10-CM

## 2018-07-05 DIAGNOSIS — E039 Hypothyroidism, unspecified: Secondary | ICD-10-CM | POA: Diagnosis not present

## 2018-07-05 DIAGNOSIS — Z23 Encounter for immunization: Secondary | ICD-10-CM | POA: Diagnosis not present

## 2018-07-05 DIAGNOSIS — D508 Other iron deficiency anemias: Secondary | ICD-10-CM

## 2018-07-05 DIAGNOSIS — Z0000 Encounter for general adult medical examination without abnormal findings: Secondary | ICD-10-CM | POA: Diagnosis not present

## 2018-07-05 DIAGNOSIS — E038 Other specified hypothyroidism: Secondary | ICD-10-CM

## 2018-07-05 DIAGNOSIS — M1000 Idiopathic gout, unspecified site: Secondary | ICD-10-CM | POA: Diagnosis not present

## 2018-07-05 DIAGNOSIS — E211 Secondary hyperparathyroidism, not elsewhere classified: Secondary | ICD-10-CM

## 2018-07-05 LAB — COMPREHENSIVE METABOLIC PANEL
ALT: 17 U/L (ref 0–35)
AST: 16 U/L (ref 0–37)
Albumin: 4.2 g/dL (ref 3.5–5.2)
Alkaline Phosphatase: 89 U/L (ref 39–117)
BUN: 33 mg/dL — AB (ref 6–23)
CHLORIDE: 101 meq/L (ref 96–112)
CO2: 26 meq/L (ref 19–32)
CREATININE: 1.58 mg/dL — AB (ref 0.40–1.20)
Calcium: 9.9 mg/dL (ref 8.4–10.5)
GFR: 34.81 mL/min — ABNORMAL LOW (ref >60.00)
GLUCOSE: 101 mg/dL — AB (ref 70–99)
POTASSIUM: 4.3 meq/L (ref 3.5–5.1)
SODIUM: 137 meq/L (ref 135–145)
Total Bilirubin: 0.4 mg/dL (ref 0.2–1.2)
Total Protein: 7.1 g/dL (ref 6.0–8.3)

## 2018-07-05 LAB — CBC WITH DIFFERENTIAL/PLATELET
BASOS ABS: 0.1 10*3/uL (ref 0.0–0.1)
Basophils Relative: 1.4 % (ref 0.0–3.0)
Eosinophils Absolute: 0.2 10*3/uL (ref 0.0–0.7)
Eosinophils Relative: 3.5 % (ref 0.0–5.0)
HCT: 36.9 % (ref 36.0–46.0)
Hemoglobin: 12.3 g/dL (ref 12.0–15.0)
LYMPHS ABS: 1.6 10*3/uL (ref 0.7–4.0)
Lymphocytes Relative: 25.5 % (ref 12.0–46.0)
MCHC: 33.4 g/dL (ref 30.0–36.0)
MCV: 86.5 fl (ref 78.0–100.0)
MONO ABS: 0.4 10*3/uL (ref 0.1–1.0)
Monocytes Relative: 5.9 % (ref 3.0–12.0)
NEUTROS ABS: 4.1 10*3/uL (ref 1.4–7.7)
NEUTROS PCT: 63.7 % (ref 43.0–77.0)
PLATELETS: 301 10*3/uL (ref 150.0–400.0)
RBC: 4.26 Mil/uL (ref 3.87–5.11)
RDW: 14.8 % (ref 11.5–15.5)
WBC: 6.4 10*3/uL (ref 4.0–10.5)

## 2018-07-05 LAB — PHOSPHORUS: Phosphorus: 3.3 mg/dL (ref 2.3–4.6)

## 2018-07-05 LAB — FERRITIN: FERRITIN: 120.6 ng/mL (ref 10.0–291.0)

## 2018-07-05 LAB — LIPID PANEL
CHOL/HDL RATIO: 5
Cholesterol: 226 mg/dL — ABNORMAL HIGH (ref 0–200)
HDL: 50 mg/dL (ref >39.00)
NONHDL: 175.6
Triglycerides: 279 mg/dL — ABNORMAL HIGH (ref 0.0–149.0)
VLDL: 55.8 mg/dL — AB (ref 0.0–40.0)

## 2018-07-05 LAB — URIC ACID: Uric Acid, Serum: 5.8 mg/dL (ref 2.4–7.0)

## 2018-07-05 LAB — LDL CHOLESTEROL, DIRECT: LDL DIRECT: 103 mg/dL

## 2018-07-05 LAB — IBC PANEL
IRON: 78 ug/dL (ref 42–145)
SATURATION RATIOS: 17.9 % — AB (ref 20.0–50.0)
TRANSFERRIN: 312 mg/dL (ref 212.0–360.0)

## 2018-07-05 LAB — TSH: TSH: 7.66 u[IU]/mL — AB (ref 0.35–4.50)

## 2018-07-05 LAB — VITAMIN D 25 HYDROXY (VIT D DEFICIENCY, FRACTURES): VITD: 39.32 ng/mL (ref 30.00–100.00)

## 2018-07-05 NOTE — Progress Notes (Signed)
Subjective:  Patient ID: [name], female    DOB: [birth year]  Age: 67 y.o. MRN: [id number]  CC: Annual Exam; Hypertension; and Hyperlipidemia   HPI [name] presents for a CPX.  She feels well today and offers no complaints.  She tells me her blood pressure has been well controlled.  She denies any recent episodes of headache, blurred vision, chest pain, shortness of breath, palpitations, edema, or fatigue.  Past Medical History:  Diagnosis Date  . Anemia    Unclear etiology  . Arthritis   . Asthma in adult    seasonal mild  (allergies) , intermittent as a child, no inhaler  . Cancer [facility])    endometrial- tx/w hysterectomy  . CKD (chronic kidney disease), stage III (HCC)    GFR 30-59  . Colon cancer ([place]) 02/22/2014  . Colon polyps 12/2002  . GERD (gastroesophageal reflux disease)    sick in May 2015, multiple complaints, but one being "pushing up sensation in her chest, abdomen region", followed by cardiac at first for clearance for colonscopy- started on Prilosec temporarily, but hasn't taken in 2 weeks    . H/O Guillain-Barre syndrome    mid-late 1980's  . [facility](947.0)    last difficult headache - early June - 2015  . Heart murmur   . History of endometrial cancer   . HLD (hyperlipidemia)   . Hypertension    recent cardiac consult - 01/2014 done for compalints relative to nausea & breathing changes, clearance for colonoscopy   . Hypothyroidism    subclinical  . Peripheral vascular disease ([place])    dvt 's  . Post-streptococcal glomerulonephritis   . Sleep apnea 07/01/2017  . TMJ (dislocation of temporomandibular joint)    no problem anymore   Past Surgical History:  Procedure Laterality Date  . 27 Hr Holter Monitor  01/2014   NSR with intermittent PVCs; no arrhythmia  . ABDOMINAL HYSTERECTOMY    . COLONOSCOPY    . COLONOSCOPY W/ POLYPECTOMY    . EXPLORATORY LAPAROTOMY    . HIP ARTHROPLASTY     right  . INSERTION OF MESH N/A 08/23/2015   Procedure: INSERTION OF MESH;  Surgeon: Autumn Messing III, MD;  Location: [place];  Service: General;  Laterality: N/A;  . KNEE ARTHROSCOPY Right   . LAPAROSCOPIC RIGHT COLECTOMY Right 04/10/2014   Procedure: LAPAROSCOPIC ASSISTED  RIGHT COLECTOMY;  Surgeon: Merrie Roof, MD;  Location: [place];  Service: General;  Laterality: Right;  . LYMPHADENECTOMY     para aortic and pelvic  . NM MYOVIEW LTD  02/03/2014   LOW RISK: Exercise 6:51 / 7 METS; + EKG for Ischemia, No CP; Images negative for ischemia or infarction  . RENAL BIOPSY    . RIGHT COLECTOMY  04/10/2014   DR TOTH  . TONSILLECTOMY AND ADENOIDECTOMY    . TOTAL ABDOMINAL HYSTERECTOMY W/ BILATERAL SALPINGOOPHORECTOMY    . TRANSTHORACIC ECHOCARDIOGRAM  02/03/2014   EF 55-60%. No regional WMA, Gr 3 DD (High filling pressures); Mod LA dilation  . UPPER GASTROINTESTINAL ENDOSCOPY    . VENTRAL HERNIA REPAIR N/A 08/23/2015   Procedure: LAPAROSCOPIC ASSISTED VENTRAL HERNIA REPAIR WITH MESH;  Surgeon: Autumn Messing III, MD;  Location: [place];  Service: General;  Laterality: N/A;    reports that she has never smoked. She has never used smokeless tobacco. She reports that she does not drink alcohol or use drugs. family history includes Breast cancer (age of onset: 62) in her paternal aunt;  Colon cancer (age of onset: 81) in her sister; Colon cancer (age of onset: 110) in her father; Colon cancer (age of onset: 2) in her maternal grandfather; Colon cancer (age of onset: 53) in her cousin; Colon polyps in her father; Colonic polyp in her sister; Diabetes in her mother and sister; Pancreatic cancer (age of onset: 63) in her maternal grandmother; Valvular heart disease in her father. Allergies  Allergen Reactions  . Penicillins Other (See Comments)    Bad headache  . Prednisone Nausea Only    Nausea  . Sulfa Antibiotics Rash    Outpatient Medications Prior to Visit  Medication Sig Dispense Refill  . allopurinol (ZYLOPRIM) 100 MG tablet TAKE 1 TABLET(100 MG) BY  MOUTH DAILY 90 tablet 1  . atorvastatin (LIPITOR) 10 MG tablet Take 10 mg by mouth at bedtime.     . Biotin 1000 MCG tablet Take 1,000 mcg by mouth 3 (three) times daily. Patient reported taking 1250mg  tablet daily    . cetirizine (ZYRTEC ALLERGY) 10 MG tablet Take 1 tablet (10 mg total) by mouth at bedtime. 90 tablet 1  . Cholecalciferol 2000 units TABS Take 1 tablet (2,000 Units total) by mouth daily. 90 tablet 1  . cinacalcet (SENSIPAR) 30 MG tablet Take 30 mg by mouth daily.    . clindamycin (CLEOCIN) 150 MG capsule Take 150 mg by mouth as needed. As needed for Dental Procedures    . diltiazem (CARDIZEM CD) 240 MG 24 hr capsule Take 240 mg by mouth daily before breakfast.     . docusate sodium (COLACE) 100 MG capsule Take 100 mg by mouth at bedtime.    . ferrous sulfate (FEOSOL) 325 (65 FE) MG tablet Take 1 tablet (325 mg total) by mouth daily with breakfast. 90 tablet 1  . fluticasone (FLONASE) 50 MCG/ACT nasal spray Place 2 sprays into both nostrils daily. 16 g 6  . furosemide (LASIX) 40 MG tablet Take 40 mg by mouth daily.    . nebivolol (BYSTOLIC) 5 MG tablet Take 1 tablet (5 mg total) by mouth daily. 90 tablet 0  . triamcinolone cream (KENALOG) 0.1 % Apply 1 application topically 2 (two) times daily. 30 g 0   No facility-administered medications prior to visit.     ROS Review of Systems  Constitutional: Negative.  Negative for diaphoresis, fatigue and unexpected weight change.  HENT: Negative.  Negative for trouble swallowing and voice change.   Respiratory: Negative.  Negative for cough, chest tightness, shortness of breath and wheezing.   Cardiovascular: Negative for chest pain, palpitations and leg swelling.  Gastrointestinal: Negative for abdominal pain, constipation, diarrhea, nausea and vomiting.  Endocrine: Negative for cold intolerance and heat intolerance.  Genitourinary: Negative.  Negative for decreased urine volume, difficulty urinating, dysuria, frequency, pelvic pain  and urgency.  Musculoskeletal: Negative for arthralgias, back pain, myalgias and neck pain.  Skin: Negative.  Negative for color change and pallor.  Neurological: Negative.  Negative for dizziness, weakness and light-headedness.  Hematological: Negative for adenopathy. Does not bruise/bleed easily.  Psychiatric/Behavioral: Negative.     Objective:  BP 140/90 (BP Location: Left Arm, Patient Position: Sitting, Cuff Size: Large)   Pulse 68   Temp 98.1 F (36.7 C) (Oral)   Resp 16   Ht 5\' 4"  (1.626 m)   Wt 214 lb 4 oz (97.2 kg)   SpO2 96%   BMI 36.78 kg/m   BP Readings from Last 3 Encounters:  07/06/18 128/63  07/05/18 140/90  03/22/18 (!) 148/90  Wt Readings from Last 3 Encounters:  07/06/18 216 lb (98 kg)  07/05/18 214 lb 4 oz (97.2 kg)  04/15/18 216 lb 6.4 oz (98.2 kg)    Physical Exam  Constitutional: She is oriented to person, place, and time. No distress.  HENT:  Mouth/Throat: Oropharynx is clear and moist. No oropharyngeal exudate.  Eyes: Conjunctivae are normal. No scleral icterus.  Neck: Normal range of motion. Neck supple. No JVD present. No thyromegaly present.  Cardiovascular: Normal rate and regular rhythm. Exam reveals no gallop.  Murmur heard.  Decrescendo systolic murmur is present with a grade of 1/6.  No diastolic murmur is present. Pulmonary/Chest: Effort normal and breath sounds normal. No respiratory distress. She has no wheezes. She has no rales.  Abdominal: Soft. Bowel sounds are normal. She exhibits no mass. There is no hepatosplenomegaly. There is no tenderness.  Musculoskeletal: Normal range of motion. She exhibits no edema, tenderness or deformity.  Neurological: She is alert and oriented to person, place, and time.  Skin: Skin is warm and dry. She is not diaphoretic. No pallor.  Vitals reviewed.   Lab Results  Component Value Date   WBC 6.4 07/05/2018   HGB 12.3 07/05/2018   HCT 36.9 07/05/2018   PLT 301.0 07/05/2018   GLUCOSE 101 (H)  07/05/2018   CHOL 226 (H) 07/05/2018   TRIG 279.0 (H) 07/05/2018   HDL 50.00 07/05/2018   LDLDIRECT 103.0 07/05/2018   ALT 17 07/05/2018   AST 16 07/05/2018   NA 137 07/05/2018   K 4.3 07/05/2018   CL 101 07/05/2018   CREATININE 1.58 (H) 07/05/2018   BUN 33 (H) 07/05/2018   CO2 26 07/05/2018   TSH 7.66 (H) 07/05/2018   INR 1.00 08/23/2015    Visual Acuity Screening   Right eye Left eye Both eyes  Without correction: 20/20 20/20 20/20   With correction:     Hearing Screening Comments: Patient passed whisper test    Dg Bone Density (dxa)  Result Date: 01/28/2018 EXAM: DUAL X-RAY ABSORPTIOMETRY (DXA) FOR BONE MINERAL DENSITY IMPRESSION: Referring Physician:  Jamal Maes PATIENT: Name: [name], [name] Patient ID: [id number] Birth Date: 1951-08-01 Height: 63.7 in. Sex: Female Measured: 01/28/2018 Weight: 209.6 lbs. Indications: Bilateral Ovariectomy (65.51), Caucasian, Estrogen Deficient, Family History of Osteoporosis, Hyperparathyroid (252.1), Hysterectomy, Postmenopausal, Right hip replacement Fractures: None Treatments: Vitamin D (E933.5) ASSESSMENT: The BMD measured at Femur Neck is 0.910 g/cm2 with a T-score of -0.9. This patient is considered NORMAL according to [facility]) criteria. Right femur was excluded due to surgical hardware. L-4 was excluded due to degenerative changes. L4 was excluded due to degenerative changes. Site Region Measured Date Measured Age YA T-score BMD Significant CHANGE Left Femur Neck 01/28/2018 65.3 -0.11 0.910 g/cm2 AP Spine L1-L3 01/28/2018 65.3 -0.3 1.139 g/cm2 Left Forearm Radius 33% 01/28/2018 65.3 0.0 0.890 g/cm2 World Health Organization [facility]) criteria for post-menopausal, Caucasian Women: Normal       T-score at or above -1 SD Osteopenia   T-score between -1 and -2.5 SD Osteoporosis T-score at or below -2.5 SD RECOMMENDATION: 1. All patients should optimize calcium and vitamin D intake. 2. Consider FDA approved medical therapies in  postmenopausal women and men aged 83 years and older, based on the following: a. A hip or vertebral (clinical or morphometric) fracture b. T- score < or = -2.5 at the femoral neck or spine after appropriate evaluation to exclude secondary causes c. Low bone mass (T-score between -1.0 and -2.5 at the femoral neck  or spine) and a 10 year probability of a hip fracture > or = 3% or a 10 year probability of a major osteoporosis-related fracture > or = 20% based on the [place]-adapted WHO algorithm d. Clinician judgment and/or patient preferences may indicate treatment for people with 10-year fracture probabilities above or below these levels FOLLOW-UP: People with diagnosed cases of osteoporosis or at high risk for fracture should have regular bone mineral density tests. For patients eligible for Medicare, routine testing is allowed once every 2 years. The testing frequency can be increased to one year for patients who have rapidly progressing disease, those who are receiving or discontinuing medical therapy to restore bone mass, or have additional risk factors. I have reviewed this report and agree with the above findings. Mark A. Thornton Papas, M.D. [facility] Radiology Electronically Signed   By: Lavonia Dana M.D.   On: 01/28/2018 15:13    Assessment & Plan:   [name] was seen today for annual exam, hypertension and hyperlipidemia.  Diagnoses and all orders for this visit:  Renovascular hypertension- Her blood pressure is well controlled.  Electrolytes are normal.  Her renal function is stable. -     Comprehensive metabolic panel; Future  Subclinical hypothyroidism- Her TSH is slightly elevated.  She appears clinically euthyroid.  Will observe her for now off of thyroid replacement therapy. -     TSH; Future  Hyperparathyroidism due to vitamin D deficiency ([place])- Her calcium level is normal.  I will monitor her phosphorus, vitamin D, and PTH level. -     Comprehensive metabolic panel; Future -     VITAMIN D 25  Hydroxy (Vit-D Deficiency, Fractures); Future -     PTH, intact and calcium; Future -     Phosphorus; Future  Chronic kidney disease (CKD), stage III (moderate) ([place])- Her renal function is stable.  She will avoid nephrotoxic agents.  Will continue to maintain good blood pressure control. -     Comprehensive metabolic panel; Future  Dyslipidemia, goal LDL below 130- She has achieved her LDL goal and is doing well on the statin. -     Lipid panel; Future  Iron deficiency anemia secondary to inadequate dietary iron intake- Improvement noted. -     CBC with Differential/Platelet; Future -     IBC panel; Future -     Ferritin; Future  Idiopathic gout, unspecified chronicity, unspecified site- She has achieved her uric acid goal and is had no recent gout flares.  Will continue allopurinol at the current dose. -     Uric acid; Future  Need for pneumococcal vaccination -     Pneumococcal conjugate vaccine 13-valent  Routine general medical examination at a health care facility   I am having [name] maintain her atorvastatin, diltiazem, Cholecalciferol, docusate sodium, furosemide, clindamycin, triamcinolone cream, nebivolol, Biotin, cinacalcet, fluticasone, cetirizine, ferrous sulfate, and allopurinol.  No orders of the defined types were placed in this encounter.  See AVS for instructions about healthy living and anticipatory guidance.  Follow-up: Return in about 6 months (around 01/04/2019).  Scarlette Calico, MD

## 2018-07-05 NOTE — Patient Instructions (Signed)

## 2018-07-06 ENCOUNTER — Encounter: Payer: Self-pay | Admitting: Gastroenterology

## 2018-07-06 ENCOUNTER — Ambulatory Visit (AMBULATORY_SURGERY_CENTER): Payer: Medicare Other | Admitting: Gastroenterology

## 2018-07-06 VITALS — BP 128/63 | HR 68 | Temp 98.7°F | Resp 14 | Ht 64.0 in | Wt 216.0 lb

## 2018-07-06 DIAGNOSIS — K209 Esophagitis, unspecified without bleeding: Secondary | ICD-10-CM

## 2018-07-06 DIAGNOSIS — Z85038 Personal history of other malignant neoplasm of large intestine: Secondary | ICD-10-CM | POA: Diagnosis not present

## 2018-07-06 DIAGNOSIS — Z0000 Encounter for general adult medical examination without abnormal findings: Secondary | ICD-10-CM | POA: Insufficient documentation

## 2018-07-06 DIAGNOSIS — D126 Benign neoplasm of colon, unspecified: Secondary | ICD-10-CM

## 2018-07-06 DIAGNOSIS — Z1509 Genetic susceptibility to other malignant neoplasm: Secondary | ICD-10-CM

## 2018-07-06 DIAGNOSIS — K635 Polyp of colon: Secondary | ICD-10-CM | POA: Diagnosis not present

## 2018-07-06 DIAGNOSIS — Z86010 Personal history of colonic polyps: Secondary | ICD-10-CM

## 2018-07-06 LAB — HM COLONOSCOPY

## 2018-07-06 MED ORDER — SODIUM CHLORIDE 0.9 % IV SOLN: 500.0000 mL | Freq: Once | INTRAVENOUS | Status: DC

## 2018-07-06 NOTE — Op Note (Signed)
[place] Patient Name: [name] Procedure Date: 07/06/2018 7:59 AM MRN: [id number] Endoscopist: Milus Banister , MD Age: 67 Referring MD:  Date of Birth: 07-Apr-1951 Gender: Female Account #: [id number] Procedure:                Colonoscopy Indications:              High risk colon cancer surveillance: Personal                            history of colonic polyps, High risk colon cancer                            surveillance: Personal history of colon cancer;                            Personal history of malignant neoplasm of the                            colon; [facility] SYNDROME: 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                            Toth.04/10/2014; Microsatellite instability-high,                            loss of MLH1 and PMS2 expression; Negative mutation                            testing of the MLH1 gene. Cancer was discovered by                            colonoscopy Dr. Teena Irani 02/2014. She has                            had 4 family members with colon cancer including                            sister at 110, father in 64s. Also a cousin.                            Colonoscopy 04/2015 Dr. Ardis Hughs found one small HP.                            Colonoscopy 2017 no polyps. EGD 2017 was normal;                            Colonoscopy 1/20169 Dr. Ardis Hughs 3 polyps just distal  to the anastomosis, two adenomas completely removed                            (one piecemeal), one abnormal mucosa, biopsied and                            was SSP. Medicines:                Monitored Anesthesia Care Procedure:                Pre-Anesthesia Assessment:                           - Prior to the procedure, a History and Physical                            was performed, and patient medications and                             allergies were reviewed. The patient's tolerance of                            previous anesthesia was also reviewed. The risks                            and benefits of the procedure and the sedation                            options and risks were discussed with the patient.                            All questions were answered, and informed consent                            was obtained. Prior Anticoagulants: The patient has                            taken no previous anticoagulant or antiplatelet                            agents. ASA Grade Assessment: III - A patient with                            severe systemic disease. After reviewing the risks                            and benefits, the patient was deemed in                            satisfactory condition to undergo the procedure.                           After obtaining informed consent, the colonoscope  was passed under direct vision. Throughout the                            procedure, the patient's blood pressure, pulse, and                            oxygen saturations were monitored continuously. The                            Model CF-HQ190L [id number]) scope was introduced                            through the anus and advanced to the the                            ileocolonic anastomosis. The colonoscopy was                            performed without difficulty. The patient tolerated                            the procedure well. The quality of the bowel                            preparation was good. The rectum was photographed. Scope In: 8:12:40 AM Scope Out: 8:31:05 AM Scope Withdrawal Time: 0 hours 14 minutes 42 seconds  Total Procedure Duration: 0 hours 18 minutes 25 seconds  Findings:                 Normal appearing ileocolonic anastomosis (s/p right                            colectomy).                           A 15 mm polyp was found in the  transverse colon                            (just distal to the anastomosis). The polyp was                            sessile. Area was successfully injected with saline                            for lesion assessment, and this injection appeared                            to lift the lesion adequately. The polyp was                            removed with a hot snare. Resection and retrieval                            were complete.  The exam was otherwise without abnormality on                            direct and retroflexion views. Complications:            No immediate complications. Estimated blood loss:                            None. Estimated Blood Loss:     Estimated blood loss: none. Impression:               - Normal appearing ileocolonic anastomosis.                           - One 15 mm polyp in the transverse colon (just                            distal to the anastomosis), removed with a hot                            snare after lifting with saline submucosal                            injection. Resected and retrieved.                           - The examination was otherwise normal on direct                            and retroflexion views. Recommendation:           - Patient has a contact number available for                            emergencies. The signs and symptoms of potential                            delayed complications were discussed with the                            patient. Return to normal activities tomorrow.                            Written discharge instructions were provided to the                            patient.                           - Resume previous diet.                           - Continue present medications.                           - Repeat colonoscopy is recommended. The  colonoscopy date will be determined after pathology                            results from today's  exam become available for                            review. (likely in one year given your Lynch                            Syndrome) Milus Banister, MD 07/06/2018 8:48:43 AM This report has been signed electronically.

## 2018-07-06 NOTE — Patient Instructions (Signed)
Continue present medications. Please read handout on polyps. Please start once daily OTC Omeprazole 20 mg shortly before breakfast.     YOU HAD AN ENDOSCOPIC PROCEDURE TODAY AT [facility]:   Refer to the procedure report that was given to you for any specific questions about what was found during the examination.  If the procedure report does not answer your questions, please call your gastroenterologist to clarify.  If you requested that your care partner not be given the details of your procedure findings, then the procedure report has been included in a sealed envelope for you to review at your convenience later.  YOU SHOULD EXPECT: Some feelings of bloating in the abdomen. Passage of more gas than usual.  Walking can help get rid of the air that was put into your GI tract during the procedure and reduce the bloating. If you had a lower endoscopy (such as a colonoscopy or flexible sigmoidoscopy) you may notice spotting of blood in your stool or on the toilet paper. If you underwent a bowel prep for your procedure, you may not have a normal bowel movement for a few days.  Please Note:  You might notice some irritation and congestion in your nose or some drainage.  This is from the oxygen used during your procedure.  There is no need for concern and it should clear up in a day or so.  SYMPTOMS TO REPORT IMMEDIATELY:   Following lower endoscopy (colonoscopy or flexible sigmoidoscopy):  Excessive amounts of blood in the stool  Significant tenderness or worsening of abdominal pains  Swelling of the abdomen that is new, acute  Fever of 100F or higher   Following upper endoscopy (EGD)  Vomiting of blood or coffee ground material  New chest pain or pain under the shoulder blades  Painful or persistently difficult swallowing  New shortness of breath  Fever of 100F or higher  Black, tarry-looking stools  For urgent or emergent issues, a gastroenterologist can be reached at  any hour by calling [phone].   DIET:  We do recommend a small meal at first, but then you may proceed to your regular diet.  Drink plenty of fluids but you should avoid alcoholic beverages for 24 hours.  ACTIVITY:  You should plan to take it easy for the rest of today and you should NOT DRIVE or use heavy machinery until tomorrow (because of the sedation medicines used during the test).    FOLLOW UP: Our staff will call the number listed on your records the next business day following your procedure to check on you and address any questions or concerns that you may have regarding the information given to you following your procedure. If we do not reach you, we will leave a message.  However, if you are feeling well and you are not experiencing any problems, there is no need to return our call.  We will assume that you have returned to your regular daily activities without incident.  If any biopsies were taken you will be contacted by phone or by letter within the next 1-3 weeks.  Please call us at [phone] if you have not heard about the biopsies in 3 weeks.    SIGNATURES/CONFIDENTIALITY: You and/or your care partner have signed paperwork which will be entered into your electronic medical record.  These signatures attest to the fact that that the information above on your After Visit Summary has been reviewed and is understood.  Full responsibility of the  confidentiality of this discharge information lies with you and/or your care-partner. 

## 2018-07-06 NOTE — Progress Notes (Signed)
Spontaneous respirations throughout. VSS. Resting comfortably. To PACU on room air. Report to  RN. 

## 2018-07-06 NOTE — Assessment & Plan Note (Addendum)
We discussed end-of-life options and advanced directives and she elected not to make a decision on these at this time.  The written screening recommendations is given to patient and attached in the patent instructions or AVS.   The patient is here for annual Medicare wellness examination and management of other chronic and acute problems.   The risk factors are reflected in the social history.  The roster of all physicians providing medical care to patient - is listed in the Snapshot section of the chart.  Activities of daily living:  The patient is 100% inedpendent in all ADLs: dressing, toileting, feeding as well as independent mobility  Home safety : The patient has smoke detectors in the home. They wear seatbelts.No firearms at home ( firearms are present in the home, kept in a safe fashion). There is no violence in the home.   There is no risks for hepatitis, STDs or HIV. There is no   history of blood transfusion. They have no travel history to infectious disease endemic areas of the world.  The patient has (has not) seen their dentist in the last six month. They have (not) seen their eye doctor in the last year. They deny (admit to) any hearing difficulty and have not had audiologic testing in the last year.  They do not  have excessive sun exposure. Discussed the need for sun protection: hats, long sleeves and use of sunscreen if there is significant sun exposure.   Diet: the importance of a healthy diet is discussed. They do have a healthy (unhealthy-high fat/fast food) diet.  The patient has a regular exercise program.  The benefits of regular aerobic exercise were discussed.  Depression screen: there are no signs or vegative symptoms of depression- irritability, change in appetite, anhedonia, sadness/tearfullness.  Cognitive assessment: the patient manages all their financial and personal affairs and is actively engaged. They could relate day,date,year and events; recalled 3/3  objects at 3 minutes; performed clock-face test normally.  The following portions of the patient's history were reviewed and updated as appropriate: allergies, current medications, past family history, past medical history,  past surgical history, past social history  and problem list.  Vision, hearing, body mass index were assessed and reviewed.   During the course of the visit the patient was educated and counseled about appropriate screening and preventive services including : fall prevention , diabetes screening, nutrition counseling, colorectal cancer screening, and recommended immunizations.

## 2018-07-06 NOTE — Op Note (Signed)
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 The patient's tolerance of                            previous anesthesia was also reviewed. The risks                            and benefits of the procedure and the sedation                            options and risks were discussed with the patient.                            All questions were answered, and informed consent                            was obtained. Prior Anticoagulants: The patient has                            taken no previous anticoagulant or antiplatelet                            agents. ASA Grade Assessment: III - A patient with                            severe systemic disease. After reviewing the risks                            and benefits, the patient was deemed in                            satisfactory condition to undergo the procedure.  After obtaining informed consent, the endoscope was                            passed under direct vision. Throughout the                            procedure, the patient's blood pressure, pulse, and                            oxygen saturations were monitored continuously. The                            Endoscope was introduced through the mouth, and                            advanced to the second  part of duodenum. The upper                            GI endoscopy was accomplished without difficulty.                            The patient tolerated the procedure well. Scope In: Scope Out: Findings:                 LA Grade A (one or more mucosal breaks less than 5                            mm, not extending between tops of 2 mucosal folds)                            esophagitis was found at the gastroesophageal                            junction.                           The exam was otherwise without abnormality. Complications:            No immediate complications. Estimated blood loss:                            None. Estimated Blood Loss:     Estimated blood loss: none. Impression:               - LA Grade A reflux esophagitis.                           - The examination was otherwise normal.                           - No specimens collected. Recommendation:           - Patient has a contact number available for                            emergencies. The signs and symptoms of potential  delayed complications were discussed with the                            patient. Return to normal activities tomorrow.                            Written discharge instructions were provided to the                            patient.                           - Resume previous diet.                           - Continue present medications. Please start once                            daily OTC omeprazole (20mg  pills one pill once                            daily shortly before breakfast---for your acid                            related GERD, esophagitis).                           - Repeat upper endoscopy in 3 years per protocol. Milus Banister, MD 07/06/2018 8:52:29 AM This report has been signed electronically.

## 2018-07-06 NOTE — Progress Notes (Signed)
Pt's states no medical or surgical changes since previsit or office visit. 

## 2018-07-07 ENCOUNTER — Telehealth: Payer: Self-pay

## 2018-07-07 ENCOUNTER — Telehealth: Payer: Self-pay | Admitting: Gastroenterology

## 2018-07-07 ENCOUNTER — Other Ambulatory Visit: Payer: Self-pay | Admitting: Gastroenterology

## 2018-07-07 MED ORDER — OMEPRAZOLE 20 MG PO CPDR: 20.0000 mg | DELAYED_RELEASE_CAPSULE | Freq: Every day | ORAL | 11 refills | Status: DC

## 2018-07-07 MED ORDER — OMEPRAZOLE 20 MG PO CPDR: 20.0000 mg | DELAYED_RELEASE_CAPSULE | Freq: Two times a day (BID) | ORAL | 11 refills | Status: DC

## 2018-07-07 NOTE — Telephone Encounter (Signed)
Omeprazole 20mg  #30 with 11 refills sent to pharmacy patient notified.

## 2018-07-07 NOTE — Telephone Encounter (Signed)
  Follow up Call-  Call back number 07/06/2018 10/09/2017 05/30/2016  Post procedure Call Back phone  # [phone] cell  Permission to leave phone message Yes Yes Yes  Some recent data might be hidden     Patient questions:  Do you have a fever, pain , or abdominal swelling? No. Pain Score  0 *  Have you tolerated food without any problems? Yes.    Have you been able to return to your normal activities? Yes.    Do you have any questions about your discharge instructions: Diet   No. Medications  No. Follow up visit  No.  Do you have questions or concerns about your Care? No.  Actions: * If pain score is 4 or above: No action needed, pain <4.

## 2018-07-07 NOTE — Telephone Encounter (Signed)
Sent medication to pharmacy. Patient notified.

## 2018-07-07 NOTE — Telephone Encounter (Signed)
[name], Could you call in omeprazole 20mg  pills, one pill once daily for GERD, disp 30 with 11 refills and let her know you have taken care of it.    Thanks

## 2018-07-07 NOTE — Telephone Encounter (Signed)
Copied from [place] [id number]. Topic: General - Other >> Jul 07, 2018 11:35 AM Keene Breath wrote: Reason for CRM: Patient called to ask doctor if he could write a prescription for Omeprazole 20 mg, which was recommended by Dr. Ardis Hughs, GI.  Patient would like the medication which is least expensive or a generic.  Please advise.  CB# [phone].  This message was sent to [facility], It needs to go to GI.  Dr.Jacobs - I do not know who your CMA is, I have sent the message to you for this reason.

## 2018-07-08 ENCOUNTER — Encounter: Payer: Self-pay | Admitting: Internal Medicine

## 2018-07-08 LAB — PTH, INTACT AND CALCIUM: Calcium: 9.8 mg/dL (ref 8.6–10.4)

## 2018-07-09 ENCOUNTER — Encounter: Payer: Medicare Other | Admitting: Gastroenterology

## 2018-07-12 ENCOUNTER — Encounter: Payer: Self-pay | Admitting: Gastroenterology

## 2018-07-12 ENCOUNTER — Other Ambulatory Visit: Payer: Medicare Other

## 2018-07-12 DIAGNOSIS — E211 Secondary hyperparathyroidism, not elsewhere classified: Secondary | ICD-10-CM | POA: Diagnosis not present

## 2018-07-13 ENCOUNTER — Encounter: Payer: Self-pay | Admitting: Internal Medicine

## 2018-07-13 LAB — PTH, INTACT AND CALCIUM
Calcium: 9.1 mg/dL (ref 8.6–10.4)
PTH: 142 pg/mL — AB (ref 14–64)

## 2018-07-16 ENCOUNTER — Encounter: Payer: Self-pay | Admitting: Internal Medicine

## 2018-07-27 DIAGNOSIS — M79671 Pain in right foot: Secondary | ICD-10-CM | POA: Diagnosis not present

## 2018-07-27 DIAGNOSIS — M84374D Stress fracture, right foot, subsequent encounter for fracture with routine healing: Secondary | ICD-10-CM | POA: Diagnosis not present

## 2018-08-04 ENCOUNTER — Other Ambulatory Visit: Payer: Self-pay | Admitting: Internal Medicine

## 2018-08-04 DIAGNOSIS — Z1231 Encounter for screening mammogram for malignant neoplasm of breast: Secondary | ICD-10-CM

## 2018-08-24 DIAGNOSIS — N183 Chronic kidney disease, stage 3 (moderate): Secondary | ICD-10-CM | POA: Diagnosis not present

## 2018-08-24 DIAGNOSIS — C189 Malignant neoplasm of colon, unspecified: Secondary | ICD-10-CM | POA: Diagnosis not present

## 2018-08-24 DIAGNOSIS — I129 Hypertensive chronic kidney disease with stage 1 through stage 4 chronic kidney disease, or unspecified chronic kidney disease: Secondary | ICD-10-CM | POA: Diagnosis not present

## 2018-08-24 DIAGNOSIS — N2581 Secondary hyperparathyroidism of renal origin: Secondary | ICD-10-CM | POA: Diagnosis not present

## 2018-08-24 DIAGNOSIS — E039 Hypothyroidism, unspecified: Secondary | ICD-10-CM | POA: Diagnosis not present

## 2018-08-24 DIAGNOSIS — M109 Gout, unspecified: Secondary | ICD-10-CM | POA: Diagnosis not present

## 2018-08-24 DIAGNOSIS — E785 Hyperlipidemia, unspecified: Secondary | ICD-10-CM | POA: Diagnosis not present

## 2018-08-24 LAB — BASIC METABOLIC PANEL
BUN: 43 — AB (ref 4–21)
Creatinine: 1.6 — AB (ref 0.5–1.1)
Glucose: 84
Potassium: 4.3 (ref 3.4–5.3)
SODIUM: 137 (ref 137–147)

## 2018-09-13 ENCOUNTER — Other Ambulatory Visit: Payer: Self-pay | Admitting: Internal Medicine

## 2018-09-13 ENCOUNTER — Encounter: Payer: Self-pay | Admitting: Internal Medicine

## 2018-09-13 DIAGNOSIS — I150 Renovascular hypertension: Secondary | ICD-10-CM

## 2018-09-13 MED ORDER — NEBIVOLOL HCL 5 MG PO TABS: 5.0000 mg | ORAL_TABLET | Freq: Every day | ORAL | 1 refills | Status: DC

## 2018-09-22 ENCOUNTER — Ambulatory Visit
Admission: RE | Admit: 2018-09-22 | Discharge: 2018-09-22 | Disposition: A | Discharge status: Home / Self Care | Payer: Medicare Other | Source: Ambulatory Visit | Attending: Internal Medicine | Admitting: Internal Medicine

## 2018-09-22 DIAGNOSIS — Z1231 Encounter for screening mammogram for malignant neoplasm of breast: Secondary | ICD-10-CM

## 2018-09-22 LAB — HM MAMMOGRAPHY

## 2018-09-23 ENCOUNTER — Other Ambulatory Visit: Payer: Self-pay | Admitting: Internal Medicine

## 2018-09-23 DIAGNOSIS — R928 Other abnormal and inconclusive findings on diagnostic imaging of breast: Secondary | ICD-10-CM

## 2018-09-24 ENCOUNTER — Encounter: Payer: Self-pay | Admitting: Oncology

## 2018-09-27 ENCOUNTER — Other Ambulatory Visit: Payer: Self-pay | Admitting: Internal Medicine

## 2018-09-27 ENCOUNTER — Inpatient Hospital Stay: Payer: Medicare Other | Admitting: Oncology

## 2018-09-27 ENCOUNTER — Other Ambulatory Visit: Payer: Medicare Other

## 2018-09-27 ENCOUNTER — Other Ambulatory Visit: Payer: Self-pay

## 2018-09-27 ENCOUNTER — Inpatient Hospital Stay: Payer: Medicare Other

## 2018-09-27 ENCOUNTER — Telehealth: Payer: Self-pay | Admitting: *Deleted

## 2018-09-27 ENCOUNTER — Other Ambulatory Visit: Payer: Self-pay | Admitting: *Deleted

## 2018-09-27 DIAGNOSIS — L821 Other seborrheic keratosis: Secondary | ICD-10-CM | POA: Diagnosis not present

## 2018-09-27 DIAGNOSIS — L820 Inflamed seborrheic keratosis: Secondary | ICD-10-CM | POA: Diagnosis not present

## 2018-09-27 DIAGNOSIS — R928 Other abnormal and inconclusive findings on diagnostic imaging of breast: Secondary | ICD-10-CM

## 2018-09-27 DIAGNOSIS — E211 Secondary hyperparathyroidism, not elsewhere classified: Secondary | ICD-10-CM

## 2018-09-27 DIAGNOSIS — Z23 Encounter for immunization: Secondary | ICD-10-CM | POA: Diagnosis not present

## 2018-09-27 NOTE — Telephone Encounter (Signed)
Called patient to move appointment to 09/28/18. Patient has agreed.

## 2018-09-28 ENCOUNTER — Inpatient Hospital Stay: Payer: Medicare Other | Attending: Oncology | Admitting: Oncology

## 2018-09-28 ENCOUNTER — Ambulatory Visit
Admission: RE | Admit: 2018-09-28 | Discharge: 2018-09-28 | Disposition: A | Discharge status: Home / Self Care | Payer: Medicare Other | Source: Ambulatory Visit | Attending: Internal Medicine | Admitting: Internal Medicine

## 2018-09-28 ENCOUNTER — Inpatient Hospital Stay: Payer: Medicare Other | Admitting: *Deleted

## 2018-09-28 VITALS — BP 158/68 | HR 79 | Temp 98.1°F | Resp 18 | Ht 64.0 in | Wt 215.3 lb

## 2018-09-28 DIAGNOSIS — D509 Iron deficiency anemia, unspecified: Secondary | ICD-10-CM | POA: Insufficient documentation

## 2018-09-28 DIAGNOSIS — Z8542 Personal history of malignant neoplasm of other parts of uterus: Secondary | ICD-10-CM | POA: Diagnosis not present

## 2018-09-28 DIAGNOSIS — N6012 Diffuse cystic mastopathy of left breast: Secondary | ICD-10-CM | POA: Diagnosis not present

## 2018-09-28 DIAGNOSIS — N189 Chronic kidney disease, unspecified: Secondary | ICD-10-CM | POA: Insufficient documentation

## 2018-09-28 DIAGNOSIS — Z85038 Personal history of other malignant neoplasm of large intestine: Secondary | ICD-10-CM | POA: Diagnosis not present

## 2018-09-28 DIAGNOSIS — R928 Other abnormal and inconclusive findings on diagnostic imaging of breast: Secondary | ICD-10-CM

## 2018-09-28 DIAGNOSIS — E211 Secondary hyperparathyroidism, not elsewhere classified: Secondary | ICD-10-CM

## 2018-09-28 DIAGNOSIS — C189 Malignant neoplasm of colon, unspecified: Secondary | ICD-10-CM

## 2018-09-28 DIAGNOSIS — Z86718 Personal history of other venous thrombosis and embolism: Secondary | ICD-10-CM | POA: Insufficient documentation

## 2018-09-28 DIAGNOSIS — I129 Hypertensive chronic kidney disease with stage 1 through stage 4 chronic kidney disease, or unspecified chronic kidney disease: Secondary | ICD-10-CM | POA: Insufficient documentation

## 2018-09-28 NOTE — Progress Notes (Signed)
[place] OFFICE PROGRESS NOTE   Diagnosis: Colon cancer  INTERVAL HISTORY:   [name] returns as scheduled.  She feels well.  She underwent upper and lower endoscopy of Dr. Ardis Hughs 07/06/2018.  She was found to have esophagitis.  A 15 mm polyp was removed from the transverse colon.  The pathology revealed a sessile serrated polyp.  She is followed by Dr. Lorrene Reid for renal insufficiency.  She requests we check a PTH level today.  Objective:  Vital signs in last 24 hours:  Blood pressure (!) 158/68, pulse 79, temperature 98.1 F (36.7 C), temperature source Oral, resp. rate 18, height 5' 4"  (1.626 m), weight 215 lb 4.8 oz (97.7 kg), SpO2 97 %.    HEENT: Neck without mass Lymphatics: No cervical, supraclavicular, axillary, or inguinal nodes Resp: Lungs clear bilaterally Cardio: Regular rate and rhythm GI: No hepatosplenomegaly, no mass, mild tenderness at the left subcostal region Vascular: No leg edema, the right lower leg is slightly larger than the left side  Lab Results:  Lab Results  Component Value Date   WBC 6.4 07/05/2018   HGB 12.3 07/05/2018   HCT 36.9 07/05/2018   MCV 86.5 07/05/2018   PLT 301.0 07/05/2018   NEUTROABS 4.1 07/05/2018    CMP  Lab Results  Component Value Date   NA 137 07/05/2018   K 4.3 07/05/2018   CL 101 07/05/2018   CO2 26 07/05/2018   GLUCOSE 101 (H) 07/05/2018   BUN 33 (H) 07/05/2018   CREATININE 1.58 (H) 07/05/2018   CALCIUM 9.1 07/12/2018   PROT 7.1 07/05/2018   ALBUMIN 4.2 07/05/2018   AST 16 07/05/2018   ALT 17 07/05/2018   ALKPHOS 89 07/05/2018   BILITOT 0.4 07/05/2018   GFRNONAA 37 (L) 08/15/2015   GFRAA 43 (L) 08/15/2015    Lab Results  Component Value Date   CEA1 3.42 09/28/2017      Imaging:  US Breast Ltd Uni Left Inc Axilla  Result Date: 09/28/2018 CLINICAL DATA:  Screening recall for a possible asymmetry in the left breast. EXAM: DIGITAL DIAGNOSTIC LEFT MAMMOGRAM WITH CAD AND TOMO  ULTRASOUND LEFT BREAST COMPARISON:  Previous exam(s). ACR Breast Density Category b: There are scattered areas of fibroglandular density. FINDINGS: The possible asymmetry noted in the left breast lower inner quadrant disperses spot compression imaging consistent with fibroglandular tissue. There is a small group of 3 oval masses consistent with a cluster of cysts that persists in the medial breast. No other masses, no architectural distortion and no suspicious calcifications. Mammographic images were processed with CAD. On physical exam, no mass is palpated in the medial left breast. Targeted ultrasound is performed, showing a small cluster of cysts in the left breast at 9:30 o'clock, 3 cm the nipple, measuring 3 x 3 x 4 mm. There no other masses. IMPRESSION: 1. No evidence of breast malignancy. 2. Small benign cluster of cysts in the 9:30 o'clock position of the left breast. The area of asymmetry noted on the current screening study was predominantly due to superimposed fibroglandular tissue. RECOMMENDATION: Screening mammogram in one year.(Code:SM-B-01Y) I have discussed the findings and recommendations with the patient. Results were also provided in writing at the conclusion of the visit. If applicable, a reminder letter will be sent to the patient regarding the next appointment. BI-RADS CATEGORY  2: Benign. Electronically Signed   By: Lajean Manes M.D.   On: 09/28/2018 10:32   Mm Diag Breast Tomo Uni Left  Result Date: 09/28/2018 CLINICAL  DATA:  Screening recall for a possible asymmetry in the left breast. EXAM: DIGITAL DIAGNOSTIC LEFT MAMMOGRAM WITH CAD AND TOMO ULTRASOUND LEFT BREAST COMPARISON:  Previous exam(s). ACR Breast Density Category b: There are scattered areas of fibroglandular density. FINDINGS: The possible asymmetry noted in the left breast lower inner quadrant disperses spot compression imaging consistent with fibroglandular tissue. There is a small group of 3 oval masses consistent with a  cluster of cysts that persists in the medial breast. No other masses, no architectural distortion and no suspicious calcifications. Mammographic images were processed with CAD. On physical exam, no mass is palpated in the medial left breast. Targeted ultrasound is performed, showing a small cluster of cysts in the left breast at 9:30 o'clock, 3 cm the nipple, measuring 3 x 3 x 4 mm. There no other masses. IMPRESSION: 1. No evidence of breast malignancy. 2. Small benign cluster of cysts in the 9:30 o'clock position of the left breast. The area of asymmetry noted on the current screening study was predominantly due to superimposed fibroglandular tissue. RECOMMENDATION: Screening mammogram in one year.(Code:SM-B-01Y) I have discussed the findings and recommendations with the patient. Results were also provided in writing at the conclusion of the visit. If applicable, a reminder letter will be sent to the patient regarding the next appointment. BI-RADS CATEGORY  2: Benign. Electronically Signed   By: Lajean Manes M.D.   On: 09/28/2018 10:32    Medications: I have reviewed the patient's current medications.   Assessment/Plan:  1. 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 Microsatellite instability-high, loss of MLH1 and PMS2 expression ? Negative mutation testing of the MLH1 gene ? 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 History of uterine cancer-2006  3. history of glomerulonephritis with chronic renal insufficiency  4. family history of multiple cancers including colon cancer  5. 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 Hypertension  7. History of anemia-microcytic, likely secondary to colon cancer  8. Abdominal incisional hernia, hernia repair 08/23/2015  9. 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 Negative right lower extremity Doppler at [facility] 03/10/2017    Disposition: [name] remains in clinical remission from colon cancer.  She continues upper and lower endoscopic surveillance with Dr. Ardis Hughs.  She will seek medical attention for persistent discomfort in the left subcostal region.  She will return for an office visit and CEA in 1 year. We obtained a PTH level at her request and will forward this result to Dr. Lorrene Reid.  Betsy Coder, MD  09/28/2018  5:06 PM

## 2018-09-29 LAB — PTH, INTACT AND CALCIUM
Calcium, Total (PTH): 8.7 mg/dL (ref 8.7–10.3)
PTH: 146 pg/mL — ABNORMAL HIGH (ref 15–65)

## 2018-09-29 LAB — CEA (IN HOUSE-CHCC): CEA (CHCC-In House): 3.5 ng/mL (ref 0.00–5.00)

## 2018-09-29 NOTE — Progress Notes (Signed)
Labs faxed to Dr. Jamal Maes as ordered.

## 2018-09-30 ENCOUNTER — Telehealth: Payer: Self-pay | Admitting: Oncology

## 2018-09-30 NOTE — Telephone Encounter (Signed)
Scheduled appt per 1/14 los - f/u in one year - reminder letter sent in the mail with appt date and time

## 2018-10-01 ENCOUNTER — Encounter: Payer: Self-pay | Admitting: Internal Medicine

## 2018-10-28 DIAGNOSIS — L821 Other seborrheic keratosis: Secondary | ICD-10-CM | POA: Diagnosis not present

## 2018-10-28 DIAGNOSIS — D225 Melanocytic nevi of trunk: Secondary | ICD-10-CM | POA: Diagnosis not present

## 2018-10-28 DIAGNOSIS — L814 Other melanin hyperpigmentation: Secondary | ICD-10-CM | POA: Diagnosis not present

## 2018-10-28 DIAGNOSIS — L570 Actinic keratosis: Secondary | ICD-10-CM | POA: Diagnosis not present

## 2018-11-13 ENCOUNTER — Other Ambulatory Visit: Payer: Self-pay | Admitting: Internal Medicine

## 2018-11-13 DIAGNOSIS — M1A3710 Chronic gout due to renal impairment, right ankle and foot, without tophus (tophi): Secondary | ICD-10-CM

## 2018-11-16 ENCOUNTER — Encounter: Payer: Self-pay | Admitting: Internal Medicine

## 2018-11-17 ENCOUNTER — Ambulatory Visit (INDEPENDENT_AMBULATORY_CARE_PROVIDER_SITE_OTHER): Payer: Medicare Other | Admitting: Internal Medicine

## 2018-11-17 ENCOUNTER — Encounter: Payer: Self-pay | Admitting: Internal Medicine

## 2018-11-17 ENCOUNTER — Other Ambulatory Visit (INDEPENDENT_AMBULATORY_CARE_PROVIDER_SITE_OTHER): Payer: Medicare Other

## 2018-11-17 ENCOUNTER — Other Ambulatory Visit: Payer: Self-pay

## 2018-11-17 VITALS — BP 142/88 | HR 74 | Temp 97.4°F | Resp 16 | Ht 64.0 in | Wt 217.5 lb

## 2018-11-17 DIAGNOSIS — E039 Hypothyroidism, unspecified: Secondary | ICD-10-CM

## 2018-11-17 DIAGNOSIS — H01004 Unspecified blepharitis left upper eyelid: Secondary | ICD-10-CM | POA: Diagnosis not present

## 2018-11-17 DIAGNOSIS — H01001 Unspecified blepharitis right upper eyelid: Secondary | ICD-10-CM | POA: Diagnosis not present

## 2018-11-17 DIAGNOSIS — E211 Secondary hyperparathyroidism, not elsewhere classified: Secondary | ICD-10-CM | POA: Diagnosis not present

## 2018-11-17 DIAGNOSIS — J301 Allergic rhinitis due to pollen: Secondary | ICD-10-CM | POA: Diagnosis not present

## 2018-11-17 DIAGNOSIS — N183 Chronic kidney disease, stage 3 unspecified: Secondary | ICD-10-CM

## 2018-11-17 DIAGNOSIS — E038 Other specified hypothyroidism: Secondary | ICD-10-CM

## 2018-11-17 DIAGNOSIS — H6982 Other specified disorders of Eustachian tube, left ear: Secondary | ICD-10-CM | POA: Diagnosis not present

## 2018-11-17 LAB — BASIC METABOLIC PANEL
BUN: 34 mg/dL — ABNORMAL HIGH (ref 6–23)
CO2: 27 mEq/L (ref 19–32)
Calcium: 8.6 mg/dL (ref 8.4–10.5)
Chloride: 103 mEq/L (ref 96–112)
Creatinine, Ser: 1.53 mg/dL — ABNORMAL HIGH (ref 0.40–1.20)
GFR: 33.95 mL/min — ABNORMAL LOW (ref >60.00)
Glucose, Bld: 70 mg/dL (ref 70–99)
Potassium: 3.9 mEq/L (ref 3.5–5.1)
SODIUM: 140 meq/L (ref 135–145)

## 2018-11-17 LAB — TSH: TSH: 3.38 u[IU]/mL (ref 0.35–4.50)

## 2018-11-17 MED ORDER — METHYLPREDNISOLONE 4 MG PO TBPK: ORAL_TABLET | ORAL | 0 refills | Status: DC

## 2018-11-17 MED ORDER — FLUTICASONE PROPIONATE 50 MCG/ACT NA SUSP: 2.0000 | Freq: Every day | NASAL | 6 refills | Status: AC

## 2018-11-17 MED ORDER — ERYTHROMYCIN 5 MG/GM OP OINT: 1.0000 "application " | TOPICAL_OINTMENT | Freq: Every day | OPHTHALMIC | 3 refills | Status: AC

## 2018-11-17 MED ORDER — FEXOFENADINE HCL 180 MG PO TABS: 180.0000 mg | ORAL_TABLET | Freq: Every day | ORAL | 1 refills | Status: AC

## 2018-11-17 NOTE — Progress Notes (Signed)
Subjective:  Patient ID: [name], female    DOB: [birth year]  Age: 68 y.o. MRN: [id number]  CC: Hypertension and Allergic Rhinitis    HPI [name] presents for f/up - She complains of a 2-week history of nasal congestion, runny nose, sneezing, postnasal drip, and popping and pressure in her left ear.  She has not gotten much relief with Allegra.  She has not been consistently using her steroid nasal spray.  She also complains of weight gain and wants to monitor her thyroid level and her PTH level.  Outpatient Medications Prior to Visit  Medication Sig Dispense Refill  . allopurinol (ZYLOPRIM) 100 MG tablet TAKE 1 TABLET(100 MG) BY MOUTH DAILY 90 tablet 1  . atorvastatin (LIPITOR) 10 MG tablet Take 10 mg by mouth at bedtime.     . Biotin 1000 MCG tablet Take 1,000 mcg by mouth 3 (three) times daily. Patient reported taking 1250mg  tablet daily    . Cholecalciferol 2000 units TABS Take 1 tablet (2,000 Units total) by mouth daily. 90 tablet 1  . cinacalcet (SENSIPAR) 60 MG tablet Take 60 mg by mouth daily.    . clindamycin (CLEOCIN) 150 MG capsule Take 150 mg by mouth as needed. As needed for Dental Procedures    . diltiazem (CARDIZEM CD) 240 MG 24 hr capsule Take 240 mg by mouth daily before breakfast.     . docusate sodium (COLACE) 100 MG capsule Take 100 mg by mouth at bedtime.    . ferrous sulfate (FEOSOL) 325 (65 FE) MG tablet Take 1 tablet (325 mg total) by mouth daily with breakfast. 90 tablet 1  . furosemide (LASIX) 40 MG tablet Take 40 mg by mouth daily.    Marland Kitchen levothyroxine (SYNTHROID, LEVOTHROID) 25 MCG tablet Take 25 mcg by mouth daily before breakfast.    . nebivolol (BYSTOLIC) 5 MG tablet Take 1 tablet (5 mg total) by mouth daily. 90 tablet 1  . omeprazole (PRILOSEC) 20 MG capsule Take 1 capsule (20 mg total) by mouth daily. 30 capsule 11  . triamcinolone cream (KENALOG) 0.1 % Apply 1 application topically 2 (two) times daily. 30 g 0  .  cetirizine (ZYRTEC ALLERGY) 10 MG tablet Take 1 tablet (10 mg total) by mouth at bedtime. 90 tablet 1  . fluticasone (FLONASE) 50 MCG/ACT nasal spray Place 2 sprays into both nostrils daily. 16 g 6  . cinacalcet (SENSIPAR) 30 MG tablet Take 30 mg by mouth daily.     No facility-administered medications prior to visit.     ROS Review of Systems  Constitutional: Positive for unexpected weight change (wt gain). Negative for chills and fever.  HENT: Positive for congestion, ear pain, postnasal drip, rhinorrhea and sneezing. Negative for ear discharge, facial swelling, hearing loss, nosebleeds, sinus pressure, sinus pain, sore throat, tinnitus, trouble swallowing and voice change.   Respiratory: Negative for cough, chest tightness, shortness of breath and wheezing.   Cardiovascular: Negative for chest pain, palpitations and leg swelling.  Gastrointestinal: Negative for abdominal pain, diarrhea, nausea and vomiting.  Endocrine: Negative.   Genitourinary: Negative.  Negative for difficulty urinating.  Musculoskeletal: Negative.  Negative for arthralgias and myalgias.  Skin: Negative.  Negative for color change and rash.  Neurological: Negative.  Negative for dizziness, weakness, light-headedness, numbness and headaches.  Hematological: Negative for adenopathy. Does not bruise/bleed easily.  Psychiatric/Behavioral: Negative.     Objective:  BP (!) 142/88 (BP Location: Left Arm, Patient Position: Sitting, Cuff Size: Large)   Pulse 74  Temp (!) 97.4 F (36.3 C) (Oral)   Resp 16   Ht 5\' 4"  (1.626 m)   Wt 217 lb 8 oz (98.7 kg)   SpO2 97%   BMI 37.33 kg/m   BP Readings from Last 3 Encounters:  11/17/18 (!) 142/88  09/28/18 (!) 158/68  07/06/18 128/63    Wt Readings from Last 3 Encounters:  11/17/18 217 lb 8 oz (98.7 kg)  09/28/18 215 lb 4.8 oz (97.7 kg)  07/06/18 216 lb (98 kg)    Physical Exam Constitutional:      Appearance: She is not ill-appearing or diaphoretic.  HENT:      Right Ear: Hearing and tympanic membrane normal. Tympanic membrane is not erythematous.     Left Ear: Hearing normal. A middle ear effusion (serous) is present. Tympanic membrane is not injected, scarred, perforated, erythematous, retracted or bulging.     Nose: Mucosal edema, congestion and rhinorrhea present.     Right Nostril: No epistaxis.     Left Nostril: No epistaxis.     Right Sinus: No maxillary sinus tenderness or frontal sinus tenderness.     Left Sinus: No maxillary sinus tenderness or frontal sinus tenderness.     Mouth/Throat:     Pharynx: Oropharynx is clear. No pharyngeal swelling.     Tonsils: No tonsillar exudate.  Eyes:     General: No scleral icterus.    Conjunctiva/sclera: Conjunctivae normal.  Neck:     Musculoskeletal: Normal range of motion and neck supple.  Cardiovascular:     Rate and Rhythm: Normal rate and regular rhythm.     Pulses: Normal pulses.     Heart sounds: No murmur. No gallop.   Pulmonary:     Effort: Pulmonary effort is normal. No respiratory distress.     Breath sounds: No stridor. No wheezing, rhonchi or rales.  Abdominal:     General: Bowel sounds are normal.     Palpations: There is no hepatomegaly, splenomegaly or mass.     Tenderness: There is no abdominal tenderness.  Musculoskeletal: Normal range of motion.        General: No swelling.     Right lower leg: No edema.     Left lower leg: No edema.  Skin:    General: Skin is warm and dry.  Neurological:     General: No focal deficit present.     Mental Status: She is oriented to person, place, and time. Mental status is at baseline.     Lab Results  Component Value Date   WBC 6.4 07/05/2018   HGB 12.3 07/05/2018   HCT 36.9 07/05/2018   PLT 301.0 07/05/2018   GLUCOSE 70 11/17/2018   CHOL 226 (H) 07/05/2018   TRIG 279.0 (H) 07/05/2018   HDL 50.00 07/05/2018   LDLDIRECT 103.0 07/05/2018   ALT 17 07/05/2018   AST 16 07/05/2018   NA 140 11/17/2018   K 3.9 11/17/2018   CL 103  11/17/2018   CREATININE 1.53 (H) 11/17/2018   BUN 34 (H) 11/17/2018   CO2 27 11/17/2018   TSH 3.38 11/17/2018   INR 1.00 08/23/2015    US Breast Ltd Uni Left Inc Axilla  Result Date: 09/28/2018 CLINICAL DATA:  Screening recall for a possible asymmetry in the left breast. EXAM: DIGITAL DIAGNOSTIC LEFT MAMMOGRAM WITH CAD AND TOMO ULTRASOUND LEFT BREAST COMPARISON:  Previous exam(s). ACR Breast Density Category b: There are scattered areas of fibroglandular density. FINDINGS: The possible asymmetry noted in the left breast lower  inner quadrant disperses spot compression imaging consistent with fibroglandular tissue. There is a small group of 3 oval masses consistent with a cluster of cysts that persists in the medial breast. No other masses, no architectural distortion and no suspicious calcifications. Mammographic images were processed with CAD. On physical exam, no mass is palpated in the medial left breast. Targeted ultrasound is performed, showing a small cluster of cysts in the left breast at 9:30 o'clock, 3 cm the nipple, measuring 3 x 3 x 4 mm. There no other masses. IMPRESSION: 1. No evidence of breast malignancy. 2. Small benign cluster of cysts in the 9:30 o'clock position of the left breast. The area of asymmetry noted on the current screening study was predominantly due to superimposed fibroglandular tissue. RECOMMENDATION: Screening mammogram in one year.(Code:SM-B-01Y) I have discussed the findings and recommendations with the patient. Results were also provided in writing at the conclusion of the visit. If applicable, a reminder letter will be sent to the patient regarding the next appointment. BI-RADS CATEGORY  2: Benign. Electronically Signed   By: Lajean Manes M.D.   On: 09/28/2018 10:32   Mm Diag Breast Tomo Uni Left  Result Date: 09/28/2018 CLINICAL DATA:  Screening recall for a possible asymmetry in the left breast. EXAM: DIGITAL DIAGNOSTIC LEFT MAMMOGRAM WITH CAD AND TOMO ULTRASOUND  LEFT BREAST COMPARISON:  Previous exam(s). ACR Breast Density Category b: There are scattered areas of fibroglandular density. FINDINGS: The possible asymmetry noted in the left breast lower inner quadrant disperses spot compression imaging consistent with fibroglandular tissue. There is a small group of 3 oval masses consistent with a cluster of cysts that persists in the medial breast. No other masses, no architectural distortion and no suspicious calcifications. Mammographic images were processed with CAD. On physical exam, no mass is palpated in the medial left breast. Targeted ultrasound is performed, showing a small cluster of cysts in the left breast at 9:30 o'clock, 3 cm the nipple, measuring 3 x 3 x 4 mm. There no other masses. IMPRESSION: 1. No evidence of breast malignancy. 2. Small benign cluster of cysts in the 9:30 o'clock position of the left breast. The area of asymmetry noted on the current screening study was predominantly due to superimposed fibroglandular tissue. RECOMMENDATION: Screening mammogram in one year.(Code:SM-B-01Y) I have discussed the findings and recommendations with the patient. Results were also provided in writing at the conclusion of the visit. If applicable, a reminder letter will be sent to the patient regarding the next appointment. BI-RADS CATEGORY  2: Benign. Electronically Signed   By: Lajean Manes M.D.   On: 09/28/2018 10:32    Assessment & Plan:   Latrease was seen today for hypertension and allergic rhinitis .  Diagnoses and all orders for this visit:  Hyperparathyroidism due to vitamin D deficiency ([place])- I will monitor her calcium and PTH levels and she will discuss the results with her nephrologist. -     Basic metabolic panel; Future -     PTH, intact and calcium; Future  Subclinical hypothyroidism- Her TSH is in the normal range.  She will remain on the current dose of levothyroxine. -     TSH; Future  Blepharitis of upper eyelids of both eyes,  unspecified type-she is controlling this with topical erythromycin ointment. -     erythromycin ophthalmic ointment; Place 1 application into both eyes at bedtime.  Seasonal allergic rhinitis due to pollen- She is having a flareup of her symptoms with eustachian tube dysfunction.  I  have asked her to be more compliant with the fluticasone nasal spray and to try a course of systemic steroids. -     methylPREDNISolone (MEDROL DOSEPAK) 4 MG TBPK tablet; TAKE AS DIRECTED -     fluticasone (FLONASE) 50 MCG/ACT nasal spray; Place 2 sprays into both nostrils daily. -     fexofenadine (ALLEGRA) 180 MG tablet; Take 1 tablet (180 mg total) by mouth daily.  Eustachian tube dysfunction, left- As above -     methylPREDNISolone (MEDROL DOSEPAK) 4 MG TBPK tablet; TAKE AS DIRECTED -     fluticasone (FLONASE) 50 MCG/ACT nasal spray; Place 2 sprays into both nostrils daily.  Chronic kidney disease (CKD), stage III (moderate) ([place])- Her renal function is stable.   I have discontinued Erin Nearing "DEBBIE"'s cetirizine. I am also having her start on erythromycin, methylPREDNISolone, and fexofenadine. Additionally, I am having her maintain her atorvastatin, diltiazem, Cholecalciferol, docusate sodium, furosemide, clindamycin, triamcinolone cream, Biotin, ferrous sulfate, omeprazole, nebivolol, levothyroxine, allopurinol, cinacalcet, and fluticasone.  Meds ordered this encounter  Medications  . erythromycin ophthalmic ointment    Sig: Place 1 application into both eyes at bedtime.    Dispense:  3.5 g    Refill:  3  . methylPREDNISolone (MEDROL DOSEPAK) 4 MG TBPK tablet    Sig: TAKE AS DIRECTED    Dispense:  21 tablet    Refill:  0  . fluticasone (FLONASE) 50 MCG/ACT nasal spray    Sig: Place 2 sprays into both nostrils daily.    Dispense:  16 g    Refill:  6  . fexofenadine (ALLEGRA) 180 MG tablet    Sig: Take 1 tablet (180 mg total) by mouth daily.    Dispense:  90 tablet    Refill:  1      Follow-up: Return if symptoms worsen or fail to improve.  Scarlette Calico, MD

## 2018-11-17 NOTE — Patient Instructions (Signed)

## 2018-11-20 ENCOUNTER — Encounter: Payer: Self-pay | Admitting: Internal Medicine

## 2018-11-22 NOTE — Telephone Encounter (Signed)
Spoke to lab and they are checking on the PTH results from 11/17/2018.

## 2018-11-23 ENCOUNTER — Encounter: Payer: Self-pay | Admitting: Internal Medicine

## 2018-11-23 ENCOUNTER — Other Ambulatory Visit: Payer: Self-pay | Admitting: Internal Medicine

## 2018-11-23 ENCOUNTER — Other Ambulatory Visit: Payer: Medicare Other

## 2018-11-23 DIAGNOSIS — H6982 Other specified disorders of Eustachian tube, left ear: Secondary | ICD-10-CM

## 2018-11-23 DIAGNOSIS — H6502 Acute serous otitis media, left ear: Secondary | ICD-10-CM

## 2018-11-23 LAB — PTH, INTACT AND CALCIUM
Calcium: 8.7 mg/dL (ref 8.6–10.4)
PTH: 28 pg/mL (ref 14–64)

## 2018-11-23 MED ORDER — AZITHROMYCIN 500 MG PO TABS: 500.0000 mg | ORAL_TABLET | Freq: Every day | ORAL | 0 refills | Status: AC

## 2018-11-23 NOTE — Telephone Encounter (Signed)
Lab called and stated that [facility] is looking into the delay of the PTH lab that was collected on 11/17/2018

## 2018-12-16 ENCOUNTER — Other Ambulatory Visit: Payer: Self-pay | Admitting: Internal Medicine

## 2019-01-10 DIAGNOSIS — N183 Chronic kidney disease, stage 3 (moderate): Secondary | ICD-10-CM | POA: Diagnosis not present

## 2019-03-11 ENCOUNTER — Encounter: Payer: Self-pay | Admitting: Internal Medicine

## 2019-03-21 ENCOUNTER — Other Ambulatory Visit: Payer: Self-pay | Admitting: Internal Medicine

## 2019-03-21 DIAGNOSIS — I150 Renovascular hypertension: Secondary | ICD-10-CM

## 2019-03-21 MED ORDER — NEBIVOLOL HCL 5 MG PO TABS: 5.0000 mg | ORAL_TABLET | Freq: Every day | ORAL | 1 refills | Status: AC

## 2019-03-23 IMAGING — MG DIGITAL DIAGNOSTIC UNILATERAL LEFT MAMMOGRAM WITH TOMO AND CAD
4 series · 4 of 12 positions shown · non-contrast
Comparison: Previous exam(s).

CLINICAL DATA: Screening recall for a possible asymmetry in the
left breast.

EXAM:
DIGITAL DIAGNOSTIC LEFT MAMMOGRAM WITH CAD AND TOMO
ULTRASOUND LEFT BREAST

[L MLO synth-2D]
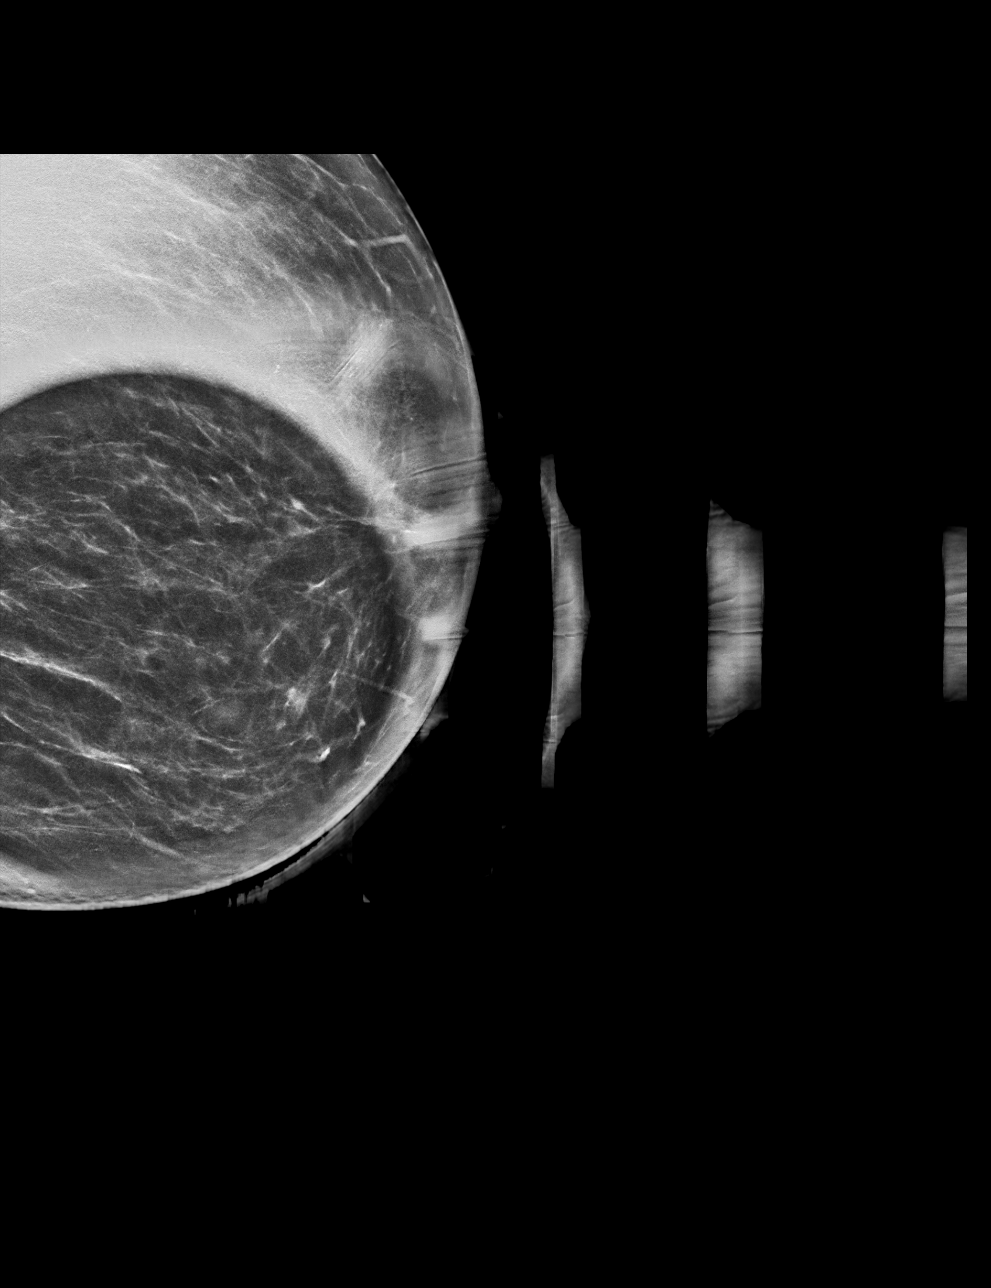

[L CC synth-2D]
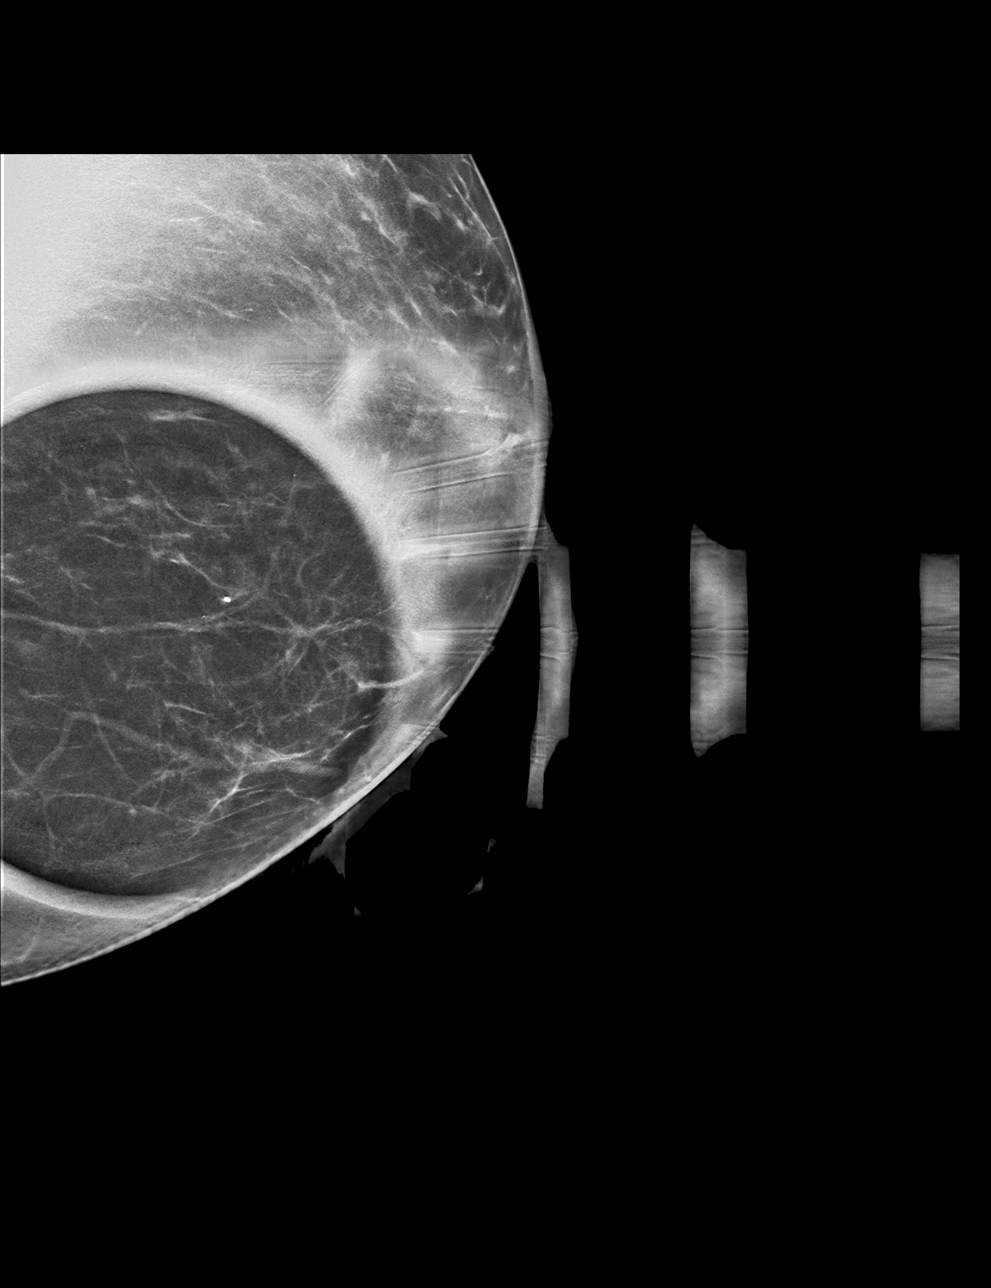

[L CC tomo · tomo slice 31/61.0]
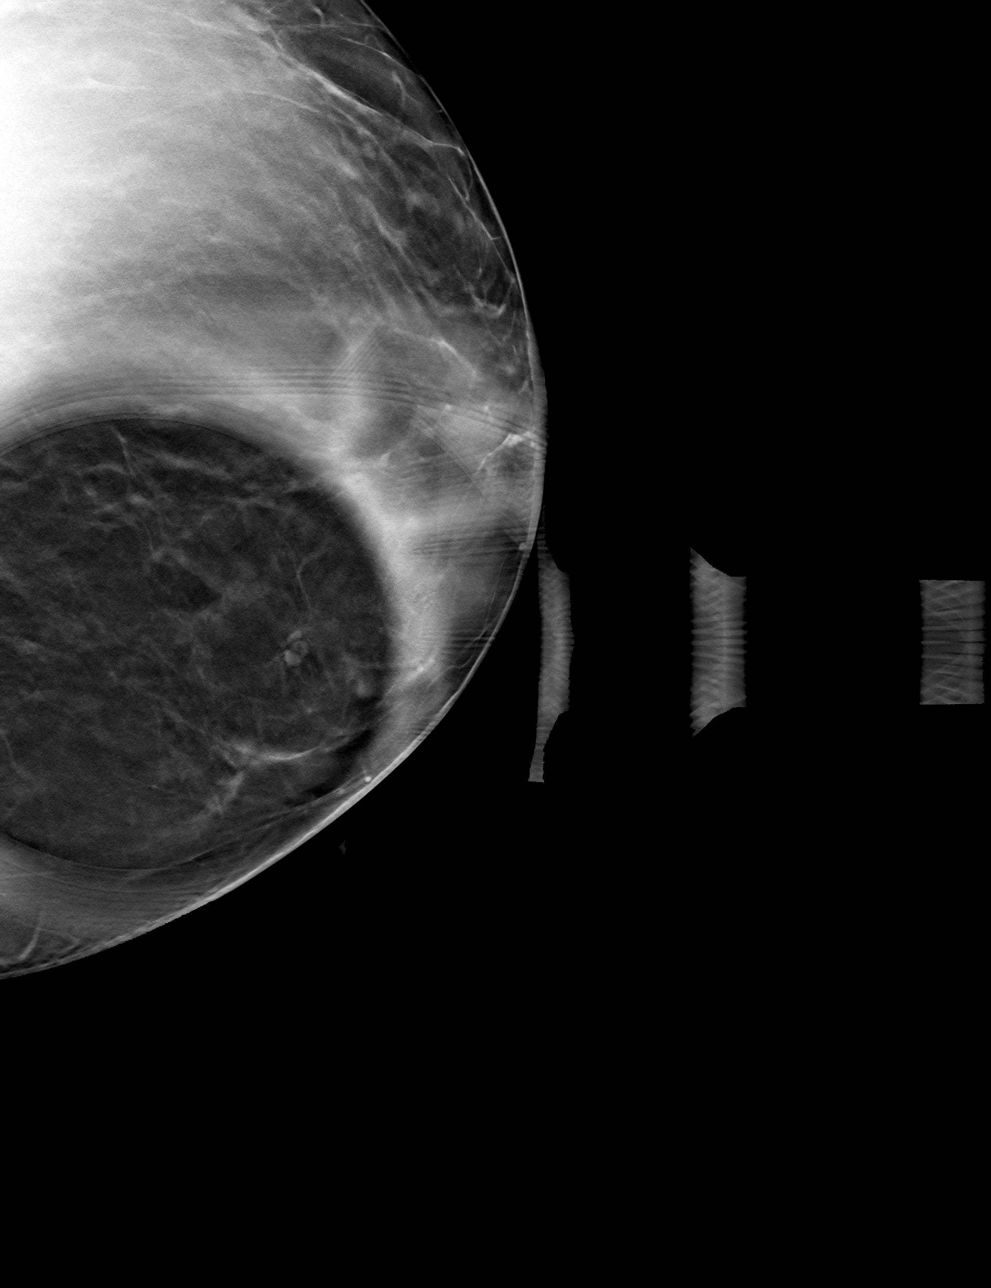

[L MLO tomo · tomo slice 37/73.0]
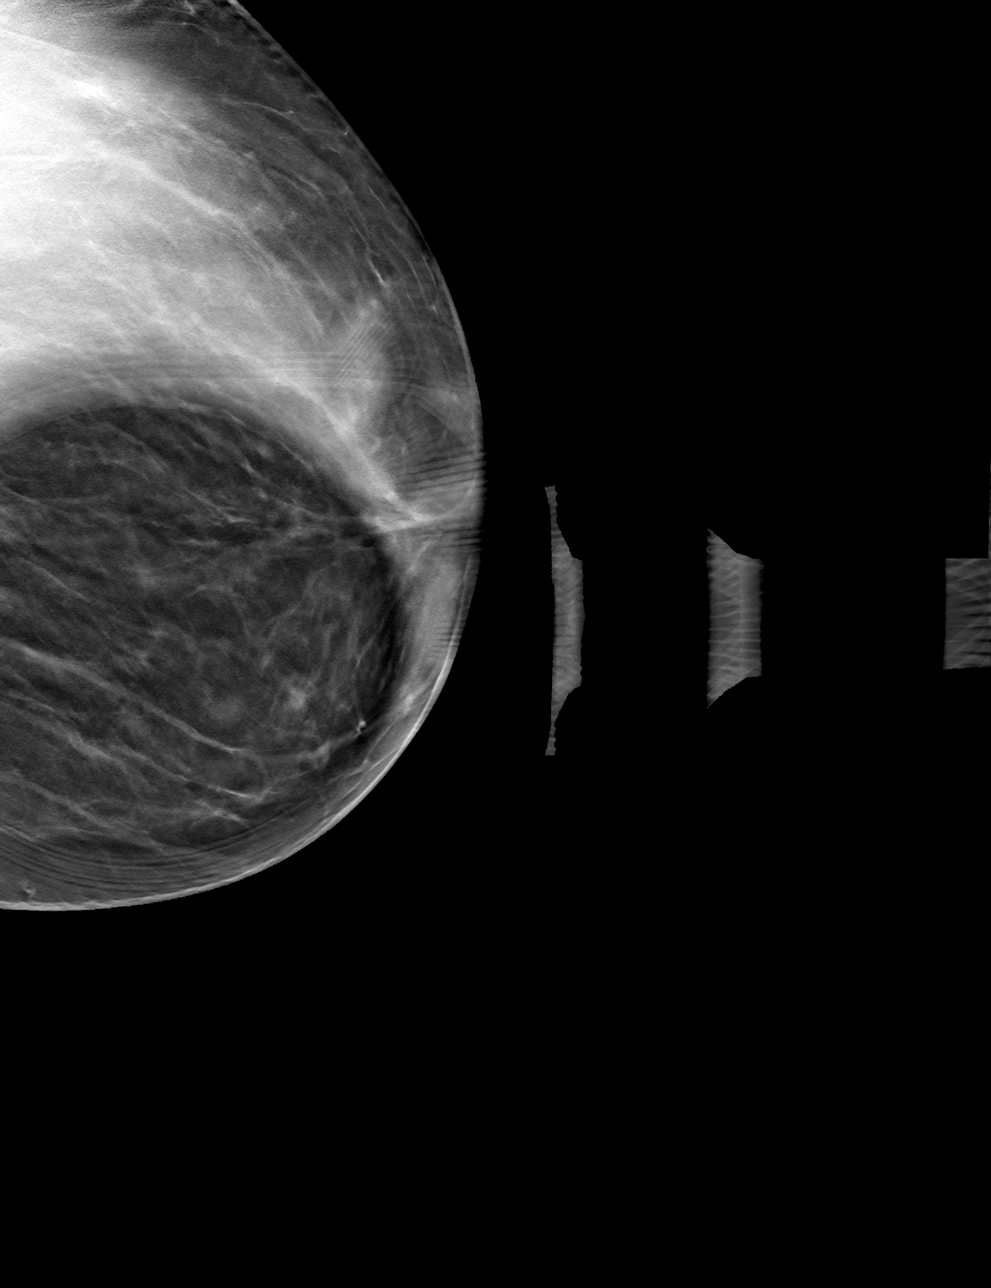

[4 of 12 positions shown; findings below may reference images not displayed]

ACR Breast Density Category b: There are scattered areas of
fibroglandular density.
FINDINGS: The possible asymmetry noted in the left breast lower inner quadrant
disperses spot compression imaging consistent with fibroglandular
tissue. There is a small group of 3 oval masses consistent with a
cluster of cysts that persists in the medial breast. No other
masses, no architectural distortion and no suspicious
calcifications.

Mammographic images were processed with CAD.

On physical exam, no mass is palpated in the medial left breast.

Targeted ultrasound is performed, showing a small cluster of cysts
in the left breast at 9:30 o'clock, 3 cm the nipple, measuring 3 x 3
x 4 mm. There no other masses.
IMPRESSION: 1. No evidence of breast malignancy.
2. Small benign cluster of cysts in the 9:30 o'clock position of the
left breast. The area of asymmetry noted on the current screening
study was predominantly due to superimposed fibroglandular tissue.

RECOMMENDATION:
Screening mammogram in one year.(Code:[id number])

I have discussed the findings and recommendations with the patient.
Results were also provided in writing at the conclusion of the
visit. If applicable, a reminder letter will be sent to the patient
regarding the next appointment.

BI-RADS CATEGORY  2: Benign.

## 2019-05-09 ENCOUNTER — Other Ambulatory Visit: Payer: Self-pay | Admitting: Internal Medicine

## 2019-05-09 DIAGNOSIS — M1A3710 Chronic gout due to renal impairment, right ankle and foot, without tophus (tophi): Secondary | ICD-10-CM

## 2019-05-09 MED ORDER — ALLOPURINOL 100 MG PO TABS: ORAL_TABLET | ORAL | 1 refills | Status: DC

## 2019-05-10 ENCOUNTER — Encounter: Payer: Self-pay | Admitting: Internal Medicine

## 2019-05-23 ENCOUNTER — Encounter: Payer: Self-pay | Admitting: Gastroenterology

## 2019-05-24 ENCOUNTER — Encounter: Payer: Self-pay | Admitting: Gastroenterology

## 2019-06-07 ENCOUNTER — Ambulatory Visit: Payer: Medicare Other | Admitting: Internal Medicine

## 2019-06-07 DIAGNOSIS — N2581 Secondary hyperparathyroidism of renal origin: Secondary | ICD-10-CM | POA: Diagnosis not present

## 2019-06-07 DIAGNOSIS — E785 Hyperlipidemia, unspecified: Secondary | ICD-10-CM | POA: Diagnosis not present

## 2019-06-07 DIAGNOSIS — C189 Malignant neoplasm of colon, unspecified: Secondary | ICD-10-CM | POA: Diagnosis not present

## 2019-06-07 DIAGNOSIS — M109 Gout, unspecified: Secondary | ICD-10-CM | POA: Diagnosis not present

## 2019-06-07 DIAGNOSIS — I129 Hypertensive chronic kidney disease with stage 1 through stage 4 chronic kidney disease, or unspecified chronic kidney disease: Secondary | ICD-10-CM | POA: Diagnosis not present

## 2019-06-07 DIAGNOSIS — N183 Chronic kidney disease, stage 3 (moderate): Secondary | ICD-10-CM | POA: Diagnosis not present

## 2019-06-07 DIAGNOSIS — E039 Hypothyroidism, unspecified: Secondary | ICD-10-CM | POA: Diagnosis not present

## 2019-06-17 ENCOUNTER — Other Ambulatory Visit: Payer: Self-pay

## 2019-06-17 ENCOUNTER — Ambulatory Visit (AMBULATORY_SURGERY_CENTER): Payer: Self-pay | Admitting: *Deleted

## 2019-06-17 VITALS — Temp 96.9°F | Ht 64.0 in | Wt 211.0 lb

## 2019-06-17 DIAGNOSIS — Z1509 Genetic susceptibility to other malignant neoplasm: Secondary | ICD-10-CM

## 2019-06-17 DIAGNOSIS — Z85038 Personal history of other malignant neoplasm of large intestine: Secondary | ICD-10-CM

## 2019-06-17 NOTE — Progress Notes (Signed)
No egg or soy allergy known to patient  No issues with past sedation with any surgeries  or procedures, no intubation problems  No diet pills per patient No home 02 use per patient  No blood thinners per patient  Pt denies issues with constipation , takes a stool softner every day No A fib or A flutter  EMMI information given. Patient stating she always uses Miralax prep. Last procedure with good clean out Miralax.  Due to the COVID-19 pandemic we are asking patients to follow these guidelines. Please only bring one care partner. Please be aware that your care partner may wait in the car in the parking lot or if they feel like they will be too hot to wait in the car, they may wait in the lobby on the 4th floor. All care partners are required to wear a mask the entire time (we do not have any that we can provide them), they need to practice social distancing, and we will do a Covid check for all patient's and care partners when you arrive. Also we will check their temperature and your temperature. If the care partner waits in their car they need to stay in the parking lot the entire time and we will call them on their cell phone when the patient is ready for discharge so they can bring the car to the front of the building. Also all patient's will need to wear a mask into building.

## 2019-06-23 ENCOUNTER — Ambulatory Visit: Payer: Medicare Other | Admitting: Internal Medicine

## 2019-06-23 DIAGNOSIS — D3131 Benign neoplasm of right choroid: Secondary | ICD-10-CM | POA: Diagnosis not present

## 2019-06-23 DIAGNOSIS — H524 Presbyopia: Secondary | ICD-10-CM | POA: Diagnosis not present

## 2019-06-23 DIAGNOSIS — Z961 Presence of intraocular lens: Secondary | ICD-10-CM | POA: Diagnosis not present

## 2019-06-30 ENCOUNTER — Telehealth: Payer: Self-pay

## 2019-06-30 NOTE — Telephone Encounter (Signed)
Covid-19 screening questions   Do you now or have you had a fever in the last 14 days? NO   Do you have any respiratory symptoms of shortness of breath or cough now or in the last 14 days? NO  Do you have any family members or close contacts with diagnosed or suspected Covid-19 in the past 14 days? NO  Have you been tested for Covid-19 and found to be positive? NO        

## 2019-07-01 ENCOUNTER — Ambulatory Visit (AMBULATORY_SURGERY_CENTER): Payer: Medicare Other | Admitting: Gastroenterology

## 2019-07-01 ENCOUNTER — Encounter: Payer: Self-pay | Admitting: Gastroenterology

## 2019-07-01 ENCOUNTER — Other Ambulatory Visit: Payer: Self-pay

## 2019-07-01 VITALS — BP 137/79 | HR 63 | Temp 98.4°F | Resp 10 | Ht 64.0 in | Wt 211.0 lb

## 2019-07-01 DIAGNOSIS — Z85038 Personal history of other malignant neoplasm of large intestine: Secondary | ICD-10-CM | POA: Diagnosis not present

## 2019-07-01 DIAGNOSIS — C189 Malignant neoplasm of colon, unspecified: Secondary | ICD-10-CM

## 2019-07-01 DIAGNOSIS — D124 Benign neoplasm of descending colon: Secondary | ICD-10-CM | POA: Diagnosis not present

## 2019-07-01 DIAGNOSIS — I509 Heart failure, unspecified: Secondary | ICD-10-CM | POA: Diagnosis not present

## 2019-07-01 DIAGNOSIS — D123 Benign neoplasm of transverse colon: Secondary | ICD-10-CM | POA: Diagnosis not present

## 2019-07-01 DIAGNOSIS — Z1509 Genetic susceptibility to other malignant neoplasm: Secondary | ICD-10-CM | POA: Diagnosis not present

## 2019-07-01 DIAGNOSIS — Z86010 Personal history of colonic polyps: Secondary | ICD-10-CM | POA: Diagnosis not present

## 2019-07-01 DIAGNOSIS — N183 Chronic kidney disease, stage 3 unspecified: Secondary | ICD-10-CM | POA: Diagnosis not present

## 2019-07-01 MED ORDER — SODIUM CHLORIDE 0.9 % IV SOLN: 500.0000 mL | Freq: Once | INTRAVENOUS | Status: DC

## 2019-07-01 NOTE — Progress Notes (Signed)
Report given to PACU, vss 

## 2019-07-01 NOTE — Op Note (Signed)
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 Cancer was discovered by                            colonoscopy Dr. Teena Irani 02/2014. She has had 4                            family members with colon cancer including sister                            at 3, father in 20s. Also a cousin. Colonoscopy                            04/2015 Dr. Ardis Hughs found one small HP. Colonoscopy                            2017 no polyps. EGD 2017 was normal; Colonoscopy                            09/2017 Dr. Ardis Hughs 3 polyps just distal to the                            anastomosis, two adenomas completely removed (one  piecemeal), one abnormal mucosa, biopsied and was                            SSP. Colonosccopy 06/2018 63m SSP just distal to                            the IC anastomosis, site tattoo'd. EGD 06/2018                            essentially normal. Medicines:                Monitored Anesthesia Care Procedure:                Pre-Anesthesia Assessment:                           - Prior to the procedure, a History and Physical                            was  performed, and patient medications and                            allergies were reviewed. The patient's tolerance of                            previous anesthesia was also reviewed. The risks                            and benefits of the procedure and the sedation                            options and risks were discussed with the patient.                            All questions were answered, and informed consent                            was obtained. Prior Anticoagulants: The patient has                            taken no previous anticoagulant or antiplatelet                            agents. ASA Grade Assessment: II - A patient with                            mild systemic disease. After reviewing the risks                            and benefits, the patient was deemed in                            satisfactory condition to undergo the procedure.  After obtaining informed consent, the colonoscope                            was passed under direct vision. Throughout the                            procedure, the patient's blood pressure, pulse, and                            oxygen saturations were monitored continuously. The                            Colonoscope was introduced through the anus and                            advanced to the the ileocolonic anastomosis. The                            colonoscopy was performed without difficulty. The                            patient tolerated the procedure well. The quality                            of the bowel preparation was excellent. The rectum                            was photographed. Scope In: 8:09:19 AM Scope Out: 8:20:54 AM Scope Withdrawal Time: 0 hours 8 minutes 33 seconds  Total Procedure Duration: 0 hours 11 minutes 35 seconds  Findings:                 Normal ileocecal anastomosis.                           Three sessile polyps were found in the descending                             colon and transverse colon. The largest was just                            distal to the IC anastomosis, in region of previous                            tattoo. The polyps were 2 to 7 mm in size. These                            polyps were removed with a cold snare. Resection                            and retrieval were complete.                           The exam was otherwise without abnormality on  direct and retroflexion views. Complications:            No immediate complications. Estimated blood loss:                            None. Estimated Blood Loss:     Estimated blood loss: none. Impression:               - Normal ileocecal anastomosis.                           - Three 2 to 7 mm polyps in the descending colon                            and in the transverse colon, removed with a cold                            snare. Resected and retrieved.                           - The examination was otherwise normal on direct                            and retroflexion views. Recommendation:           - Patient has a contact number available for                            emergencies. The signs and symptoms of potential                            delayed complications were discussed with the                            patient. Return to normal activities tomorrow.                            Written discharge instructions were provided to the                            patient.                           - Resume previous diet.                           - Continue present medications.                           - Await path results, likely you will need repeat                            colonoscopy in 1 year given your Lynch Syndrome. Milus Banister, MD 07/01/2019 8:29:15 AM This report has been signed electronically.

## 2019-07-01 NOTE — Progress Notes (Signed)
Pt's states no medical or surgical changes since previsit or office visit.  Temp taken by JB VS taken by CW 

## 2019-07-01 NOTE — Progress Notes (Signed)
Called to room to assist during endoscopic procedure.  Patient ID and intended procedure confirmed with present staff. Received instructions for my participation in the procedure from the performing physician.  

## 2019-07-01 NOTE — Patient Instructions (Signed)
Handout on polyps given   YOU HAD AN ENDOSCOPIC PROCEDURE TODAY AT THE [facility]:   Refer to the procedure report that was given to you for any specific questions about what was found during the examination.  If the procedure report does not answer your questions, please call your gastroenterologist to clarify.  If you requested that your care partner not be given the details of your procedure findings, then the procedure report has been included in a sealed envelope for you to review at your convenience later.  YOU SHOULD EXPECT: Some feelings of bloating in the abdomen. Passage of more gas than usual.  Walking can help get rid of the air that was put into your GI tract during the procedure and reduce the bloating. If you had a lower endoscopy (such as a colonoscopy or flexible sigmoidoscopy) you may notice spotting of blood in your stool or on the toilet paper. If you underwent a bowel prep for your procedure, you may not have a normal bowel movement for a few days.  Please Note:  You might notice some irritation and congestion in your nose or some drainage.  This is from the oxygen used during your procedure.  There is no need for concern and it should clear up in a day or so.  SYMPTOMS TO REPORT IMMEDIATELY:   Following lower endoscopy (colonoscopy or flexible sigmoidoscopy):  Excessive amounts of blood in the stool  Significant tenderness or worsening of abdominal pains  Swelling of the abdomen that is new, acute  Fever of 100F or higher   For urgent or emergent issues, a gastroenterologist can be reached at any hour by calling [phone].   DIET:  We do recommend a small meal at first, but then you may proceed to your regular diet.  Drink plenty of fluids but you should avoid alcoholic beverages for 24 hours.  ACTIVITY:  You should plan to take it easy for the rest of today and you should NOT DRIVE or use heavy machinery until tomorrow (because of the sedation  medicines used during the test).    FOLLOW UP: Our staff will call the number listed on your records 48-72 hours following your procedure to check on you and address any questions or concerns that you may have regarding the information given to you following your procedure. If we do not reach you, we will leave a message.  We will attempt to reach you two times.  During this call, we will ask if you have developed any symptoms of COVID 19. If you develop any symptoms (ie: fever, flu-like symptoms, shortness of breath, cough etc.) before then, please call [phone].  If you test positive for Covid 19 in the 2 weeks post procedure, please call and report this information to us.    If any biopsies were taken you will be contacted by phone or by letter within the next 1-3 weeks.  Please call us at [phone] if you have not heard about the biopsies in 3 weeks.    SIGNATURES/CONFIDENTIALITY: You and/or your care partner have signed paperwork which will be entered into your electronic medical record.  These signatures attest to the fact that that the information above on your After Visit Summary has been reviewed and is understood.  Full responsibility of the confidentiality of this discharge information lies with you and/or your care-partner. 

## 2019-07-05 ENCOUNTER — Telehealth: Payer: Self-pay

## 2019-07-05 NOTE — Telephone Encounter (Signed)
  Follow up Call-  Call back number 07/01/2019 07/06/2018 10/09/2017  Post procedure Call Back phone  # [phone]  Permission to leave phone message Yes Yes Yes  Some recent data might be hidden     Patient questions:  Do you have a fever, pain , or abdominal swelling? No. Pain Score  0 *  Have you tolerated food without any problems? Yes.    Have you been able to return to your normal activities? Yes.    Do you have any questions about your discharge instructions: Diet   No. Medications  No. Follow up visit  No.  Do you have questions or concerns about your Care? No.  Actions: * If pain score is 4 or above: No action needed, pain <4.  1. Have you developed a fever since your procedure? no  2.   Have you had an respiratory symptoms (SOB or cough) since your procedure? no  3.   Have you tested positive for COVID 19 since your procedure no  4.   Have you had any family members/close contacts diagnosed with the COVID 19 since your procedure?  no   If yes to any of these questions please route to Joylene John, RN and Alphonsa Gin, Therapist, sports.

## 2019-07-06 ENCOUNTER — Encounter: Payer: Self-pay | Admitting: Gastroenterology

## 2019-07-06 ENCOUNTER — Other Ambulatory Visit: Payer: Self-pay | Admitting: Gastroenterology

## 2019-07-11 ENCOUNTER — Ambulatory Visit: Payer: Medicare Other | Admitting: Internal Medicine

## 2019-07-25 ENCOUNTER — Encounter: Payer: Self-pay | Admitting: Gastroenterology

## 2019-08-09 ENCOUNTER — Other Ambulatory Visit: Payer: Self-pay | Admitting: Internal Medicine

## 2019-08-09 DIAGNOSIS — M1A3710 Chronic gout due to renal impairment, right ankle and foot, without tophus (tophi): Secondary | ICD-10-CM

## 2019-08-09 MED ORDER — ALLOPURINOL 100 MG PO TABS: ORAL_TABLET | ORAL | 1 refills | Status: DC

## 2019-09-20 DIAGNOSIS — L570 Actinic keratosis: Secondary | ICD-10-CM | POA: Diagnosis not present

## 2019-09-20 DIAGNOSIS — D485 Neoplasm of uncertain behavior of skin: Secondary | ICD-10-CM | POA: Diagnosis not present

## 2019-09-20 DIAGNOSIS — U071 COVID-19: Secondary | ICD-10-CM | POA: Diagnosis not present

## 2019-09-20 DIAGNOSIS — L609 Nail disorder, unspecified: Secondary | ICD-10-CM | POA: Diagnosis not present

## 2019-09-20 DIAGNOSIS — L821 Other seborrheic keratosis: Secondary | ICD-10-CM | POA: Diagnosis not present

## 2019-09-20 DIAGNOSIS — L578 Other skin changes due to chronic exposure to nonionizing radiation: Secondary | ICD-10-CM | POA: Diagnosis not present

## 2019-09-20 DIAGNOSIS — C44311 Basal cell carcinoma of skin of nose: Secondary | ICD-10-CM | POA: Diagnosis not present

## 2019-09-26 ENCOUNTER — Telehealth: Payer: Self-pay | Admitting: Oncology

## 2019-09-26 ENCOUNTER — Telehealth: Payer: Self-pay | Admitting: *Deleted

## 2019-09-26 NOTE — Telephone Encounter (Signed)
Returned patient's phone call regarding cancelling 01/15 scheduled message, per patient's request appointment cancelled.

## 2019-09-26 NOTE — Telephone Encounter (Addendum)
Left VM that she has moved to [place], [place]. Asking if she needs to have continued oncology follow up there? IF so, does Dr. Benay Spice know of a good oncologist in the area? Dx: Colon Cancer, Stage IIA 2015. Last visit at [facility] was 09/2018--due CEA in 1 year. Per Dr. Benay Spice: Yes, continue to f/u with oncologist and GI. He does not know any specific oncologist in the area.  Patient notified. Suggested she google search for oncology practice in area.

## 2019-09-30 ENCOUNTER — Other Ambulatory Visit: Payer: Medicare Other

## 2019-09-30 ENCOUNTER — Ambulatory Visit: Payer: Medicare Other | Admitting: Oncology

## 2019-09-30 DIAGNOSIS — C44311 Basal cell carcinoma of skin of nose: Secondary | ICD-10-CM | POA: Diagnosis not present

## 2019-10-20 DIAGNOSIS — L578 Other skin changes due to chronic exposure to nonionizing radiation: Secondary | ICD-10-CM | POA: Diagnosis not present

## 2019-10-20 DIAGNOSIS — C44311 Basal cell carcinoma of skin of nose: Secondary | ICD-10-CM | POA: Diagnosis not present

## 2019-10-20 DIAGNOSIS — U071 COVID-19: Secondary | ICD-10-CM | POA: Diagnosis not present

## 2019-12-12 DIAGNOSIS — R5383 Other fatigue: Secondary | ICD-10-CM | POA: Diagnosis not present

## 2019-12-12 DIAGNOSIS — E039 Hypothyroidism, unspecified: Secondary | ICD-10-CM | POA: Diagnosis not present

## 2019-12-12 DIAGNOSIS — Z85038 Personal history of other malignant neoplasm of large intestine: Secondary | ICD-10-CM | POA: Diagnosis not present

## 2019-12-12 DIAGNOSIS — C189 Malignant neoplasm of colon, unspecified: Secondary | ICD-10-CM | POA: Diagnosis not present

## 2019-12-12 DIAGNOSIS — I10 Essential (primary) hypertension: Secondary | ICD-10-CM | POA: Diagnosis not present

## 2019-12-12 DIAGNOSIS — Z1231 Encounter for screening mammogram for malignant neoplasm of breast: Secondary | ICD-10-CM | POA: Diagnosis not present

## 2019-12-12 DIAGNOSIS — M818 Other osteoporosis without current pathological fracture: Secondary | ICD-10-CM | POA: Diagnosis not present

## 2019-12-12 DIAGNOSIS — M10072 Idiopathic gout, left ankle and foot: Secondary | ICD-10-CM | POA: Diagnosis not present

## 2019-12-12 DIAGNOSIS — K219 Gastro-esophageal reflux disease without esophagitis: Secondary | ICD-10-CM | POA: Diagnosis not present

## 2019-12-12 DIAGNOSIS — E785 Hyperlipidemia, unspecified: Secondary | ICD-10-CM | POA: Diagnosis not present

## 2019-12-19 DIAGNOSIS — Z1231 Encounter for screening mammogram for malignant neoplasm of breast: Secondary | ICD-10-CM | POA: Diagnosis not present

## 2019-12-19 DIAGNOSIS — N183 Chronic kidney disease, stage 3 unspecified: Secondary | ICD-10-CM | POA: Diagnosis not present

## 2020-01-23 ENCOUNTER — Encounter: Payer: Self-pay | Admitting: Internal Medicine

## 2020-05-02 ENCOUNTER — Encounter: Payer: Self-pay | Admitting: Internal Medicine

## 2020-05-11 ENCOUNTER — Other Ambulatory Visit: Payer: Self-pay | Admitting: Internal Medicine

## 2020-05-11 DIAGNOSIS — M1A3710 Chronic gout due to renal impairment, right ankle and foot, without tophus (tophi): Secondary | ICD-10-CM

## 2020-08-30 ENCOUNTER — Telehealth: Payer: Self-pay | Admitting: Oncology

## 2020-08-30 NOTE — Telephone Encounter (Signed)
Released records to [facility] to [phone]   [id number]

## 2020-10-27 ENCOUNTER — Encounter: Payer: Self-pay | Admitting: Gastroenterology

## 2024-03-11 ENCOUNTER — Ambulatory Visit: Payer: Self-pay | Admitting: *Deleted

## 2024-03-11 NOTE — Telephone Encounter (Signed)
 Copied from CRM [id number]. Topic: Clinical - Red Word Triage >> Mar 11, 2024  3:34 PM [name] wrote: [name] that prompted transfer to Nurse Triage: Patient called in stated she is exteremly nausea, current symptoms are redness and oozing at the side of her hysterotomy , wanted to see [name] but [name] isnt in , wanted to speak to a nurse on if she may need to go to urgent care Reason for Disposition  [1] Looks infected (spreading redness, red streak, pus) AND [2] fever    Pt is going to the [facility] urgent care at [facility]  Answer Assessment - Initial Assessment Questions 1. LOCATION: Where is the wound located?      I'm having redness and oozing from my hysterectomy site from 2006.   The red line is going around the area and the discharge is clear but it smells bad.   It's usually red in one spot but it's red in a circle now.    I usually see [name].   I'm also having nausea. No fever or chills.    I see [name].   [name] is one of my doctors too.    I can see either one.    I live in [place]  and [place].    I'm in [place] for the summer.  I don't really know which doctor I'm assigned to now.   I let her know [name] with [name] was her last doctor but she hasn't been seen since 2021 so would have to re establish to be seen.  I go to both [facility] and [facility].   I was hoping [name] could see me today.     I let her know she is assigned to [name] at [facility].    She is not established with either [facility] or [facility].   Last seen 2021.    I instructed her to go to the urgent care so she could be evaluated and started on antibiotics if the provider felt that was needed.   Pt. Was agreeable to this plan.  She mentioned going to the urgent care at [facility].    That is a [facility].  Recently opened and that would be fine to go  there if that is the one closest to her.   She decided to go there.     2. WOUND APPEARANCE: What does the wound look like?      Her hysterectomy site is looking infected. 3. SIZE: If redness is present, ask: What is the size of the red area? (Inches, centimeters, or compare to size of a coin)      Redness around the site and it's draining clear, bad smelling fluid. 4. SPREAD: What's changed in the last day?  Do you see any red streaks coming from the wound?     It's worse.  The redness is usually one spot but this time is in a circle and spreading.   5. ONSET: When did it start to look infected?      This morning the circle is spreading and red.   It's been leaking  a long time.  It's smelling bad now. 6. MECHANISM: How did the wound start, what was the cause?     It's a hysterectomy scar from 2006.  It does this every so often.  It  flares up and gets red.  My doctor down in [place]  is who I usually see.   I called them but my doctor is not in today so that's why I'm calling [place].  I was hoping to be seen today. 7. PAIN: Do you have any pain?  If Yes, ask: How bad is the pain?  (e.g., Scale 1-10; mild, moderate, or severe)    - MILD (1-3): Doesn't interfere with normal activities.     - MODERATE (4-7): Interferes with normal activities or awakens from sleep.    - SEVERE (8-10): Excruciating pain, unable to do any normal activities.       Mild pain 8. FEVER: Do you have a fever? If Yes, ask: What is your temperature, how was it measured, and when did it start?     No fever 9. OTHER SYMPTOMS: Do you have any other symptoms? (e.g., shaking chills, weakness, rash elsewhere on body)     No 10. PREGNANCY: Is there any chance you are pregnant? When was your last menstrual period?       N/A due to age  Protocols used: Wound Infection Suspected-A-AH  Pt lives in [place]  and [place].   In [place] for the summer.   Not established with either [place] practice at [facility] or  [place].   Last seen in 2021.   I referred her to the urgent care.    I offered to make her an appt for future problems so she could be established but she will decide later if she wants to do that or not.   Going on to the urgent care for now.  She thanked me very much for my assistance.    FYI Only or Action Required?: FYI only for provider.  Patient was last seen in primary care on Not established . Called Nurse Triage reporting Wound Infection. Symptoms began several days ago. Interventions attempted: Nothing. Symptoms are: rapidly worsening.  Triage Disposition: See HCP Within 4 Hours (Or PCP Triage)  Patient/caregiver understands and will follow disposition?: Yes Referred to urgent care since not established with PCP here in [place] now.

## 2024-04-05 NOTE — Telephone Encounter (Unsigned)
 Copied from CRM [id number]. Topic: Appointments - Scheduling Inquiry for Clinic >> Mar 11, 2024 12:15 PM [name] wrote: Reason for CRM: patient would like to know if she is able to be seen by Dr. Jodie. Patient states she seen her before a while ago. Express to patient that since she was last seen in 2021 she is now consider a new patient and per schedule provider doesn't show availability for new patient. Patient current symptoms are redness and oozing at the site of her hysterotomy, patient denies pain. Please contact patient to schedule CB#[phone]   ----------------------------------------------------------------------- From previous Reason for Contact - Scheduling: Patient/patient representative is calling to schedule an appointment. Refer to attachments for appointment information.

## 2024-05-13 ENCOUNTER — Other Ambulatory Visit: Payer: Self-pay | Admitting: Unknown Physician Specialty

## 2024-05-13 DIAGNOSIS — H90A21 Sensorineural hearing loss, unilateral, right ear, with restricted hearing on the contralateral side: Secondary | ICD-10-CM

## 2024-05-17 ENCOUNTER — Ambulatory Visit
Admission: RE | Admit: 2024-05-17 | Discharge: 2024-05-17 | Disposition: A | Discharge status: Home / Self Care | Source: Ambulatory Visit | Attending: Unknown Physician Specialty | Admitting: Unknown Physician Specialty

## 2024-05-17 DIAGNOSIS — H90A21 Sensorineural hearing loss, unilateral, right ear, with restricted hearing on the contralateral side: Secondary | ICD-10-CM

## 2024-05-17 MED ORDER — GADOPICLENOL 0.5 MMOL/ML IV SOLN
10.0000 mL | Freq: Once | INTRAVENOUS | Status: AC | PRN
  Administered 2024-05-17: 10 mL via INTRAVENOUS
